# Patient Record
Sex: Male | Born: 1960 | Race: White | Hispanic: No | Marital: Married | Smoking: Never smoker
Health system: Southern US, Community
[De-identification: ages and names within clinical notes are randomized; demographics above are authoritative.]

## PROBLEM LIST (undated history)

## (undated) HISTORY — PX: WRIST FRACTURE SURGERY: SHX121

---

## 2010-05-22 ENCOUNTER — Encounter (HOSPITAL_BASED_OUTPATIENT_CLINIC_OR_DEPARTMENT_OTHER)
Admission: RE | Admit: 2010-05-22 | Discharge: 2010-05-22 | Disposition: A | Discharge status: Home / Self Care | Payer: BC Managed Care – PPO | Source: Ambulatory Visit | Attending: Orthopedic Surgery | Admitting: Orthopedic Surgery

## 2010-05-24 ENCOUNTER — Ambulatory Visit (HOSPITAL_BASED_OUTPATIENT_CLINIC_OR_DEPARTMENT_OTHER)
Admission: RE | Admit: 2010-05-24 | Discharge: 2010-05-24 | Disposition: A | Discharge status: Home / Self Care | Payer: BC Managed Care – PPO | Source: Ambulatory Visit | Attending: Orthopedic Surgery | Admitting: Orthopedic Surgery

## 2010-05-24 DIAGNOSIS — J4489 Other specified chronic obstructive pulmonary disease: Secondary | ICD-10-CM | POA: Insufficient documentation

## 2010-05-24 DIAGNOSIS — S52599A Other fractures of lower end of unspecified radius, initial encounter for closed fracture: Secondary | ICD-10-CM | POA: Insufficient documentation

## 2010-05-24 DIAGNOSIS — Z87891 Personal history of nicotine dependence: Secondary | ICD-10-CM | POA: Insufficient documentation

## 2010-05-24 DIAGNOSIS — Y92838 Other recreation area as the place of occurrence of the external cause: Secondary | ICD-10-CM | POA: Insufficient documentation

## 2010-05-24 DIAGNOSIS — J449 Chronic obstructive pulmonary disease, unspecified: Secondary | ICD-10-CM | POA: Insufficient documentation

## 2010-05-24 DIAGNOSIS — Y9239 Other specified sports and athletic area as the place of occurrence of the external cause: Secondary | ICD-10-CM | POA: Insufficient documentation

## 2010-05-24 DIAGNOSIS — Z01810 Encounter for preprocedural cardiovascular examination: Secondary | ICD-10-CM | POA: Insufficient documentation

## 2010-05-26 NOTE — Op Note (Signed)
NAME:  Austin Austin, Austin Austin                ACCOUNT NO.:  [id number]  MEDICAL RECORD NO.:  [id number]           PATIENT TYPE:  LOCATION:                                 FACILITY:  PHYSICIAN:  Feliberto Gottron. Turner Daniels, M.D.        DATE OF BIRTH:  DATE OF PROCEDURE:  05/24/2010 DATE OF DISCHARGE:                              OPERATIVE REPORT   PREOPERATIVE DIAGNOSIS:  Displaced angulated right distal radius fracture.  POSTOPERATIVE DIAGNOSIS:  Displaced angulated right distal radius fracture.  PROCEDURE:  Open reduction and internal fixation with a DePuy standard right DVR plate, three proximal bicortical screws, and a full spread of distal fixed screws and pins.  SURGEON:  Feliberto Gottron. Turner Daniels, MD  FIRST ASSISTANT:  Shirl Harris, PA  ANESTHETIC:  Right axillary nerve block and general LMA.  ESTIMATED BLOOD LOSS:  Minimal.  FLUID REPLACEMENT:  A liter of crystalloid.  DRAINS PLACED:  None.  TOURNIQUET TIME:  45 minutes.  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  In order to decrease pain and increase function, he is taken for open reduction and internal fixation using a DVR plate to stabilize the fracture.  Risks and benefits of surgery have been discussed, questions answered.  DESCRIPTION OF PROCEDURE:  The patient was identified by armband and received preoperative IV antibiotics in the holding area at [facility] and taken to operating room 1.  Appropriate anesthetic monitors were attached and LMA anesthesia was induced with the patient in supine position after first receiving axillary block anesthesia in the block area.  Tourniquet was applied to the right forearm, and the right upper extremity was prepped and draped in a sterile fashion from the fingertips to the tourniquet.   Time-out procedure was performed. Limb wrapped with an Esmarch bandage, tourniquet was inflated to 250 mmHg.  We began the operation by making a longitudinal incision starting at the wrist flexion crease and going proximally for about 8 cm through the skin and subcutaneous tissue down to the FCR tendon.  The sheath was incised volarly, the tendon was retracted laterally, and we went through the dorsal aspect of the sheath dropping down onto the pronator quadratus muscle.  This was detached from the distal radius exposing the fracture site.  We used a periosteal elevator to get good visualization of the fracture.  With traction and reverse angulation, the fracture was relatively easy to reduce and at that point was selected a right regular DePuy DVR plate and applied it to the volar aspect of the distal radius at the level of the glide hole with a 12-mm bicortical 3.5 screw.  The glide hole allowed adjustment, and at this point, once again we applied traction and reduced the fracture and pinned it through one of the distal pin holes.  C-arm imaging was then brought up to confirm near anatomic reduction, and we set about fixing the distal radius to the proximal radius using a  combination of threaded fixed pins and then unthreaded fixed pins.  All holes were filled, C-arm images were taken in the AP and lateral plane confirming that the pins were well distal just below the subchondral bone and that none of them penetrated the joint.  Satisfied with the anatomic reduction on x-ray, two more of the bicortical proximal holes were filled.  Tourniquet was let down.  C-arm images were taken.  Wound irrigated out with normal saline solution, we checked for any small bleeders, and then we closed in layers of 3-0 Vicryl suture in the subcutaneous tissue and 3-0 Vicryl suture in the subcuticular tissue.  A dressing of Xeroform, 4x4 dressing sponges, Webril, and an Ace wrap applied followed by a right  Velfoam wrist forearm splint.  The patient was then awakened, extubated, and taken to the recovery room without difficulty.     Feliberto Gottron. Turner Daniels, M.D.     Ovid Curd  D:  05/24/2010  T:  05/25/2010  Job:  [id number]  Electronically Signed by Gean Birchwood M.D. on 05/26/2010 06:38:59 AM

## 2020-01-28 ENCOUNTER — Encounter: Payer: Self-pay | Admitting: Neurology

## 2020-04-27 NOTE — Progress Notes (Signed)
[facility] Clinic Note - Initial Visit   Date: 05/02/20  Austin Austin MRN: [id number] DOB: [birth year]   Dear Dr. Lenise Arena:  Thank you for your kind referral of Austin Austin for consultation of numbness/tingling. Although his history is well known to you, please allow [place] to reiterate it for the purpose of our medical record. The patient was accompanied to the clinic by self.   History of Present Illness: Austin Austin is a 60 y.o. right-handed male presenting for evaluation of numbness/tingling of the hands and feet.   Starting in January 2021, he woke up with severe leg cramps and numbness involving his right lower leg. A month later, he developed tingling in the left leg. He was referred to see a chiropractor which did not help.  He also reports tingling in the fingertips.  His symptoms are constant and he feels that exercise and cold makes it worse.  He has chronic low back pain. He has problems with coordination, especially in the hands.  No weakness or falls.  He sometimes feels the he is veering to the side when walking, but it is very subtle.   He drinks alcohol socially on the weekend. No diabetes.  He also reports easily getting tired.   Out-side paper records, electronic medical record, and images have been reviewed where available and summarized as:  Labs 01/25/2020:  Folate > 20, vitamin B12 915  History reviewed. No pertinent past medical history.  Past Surgical History:  Procedure Laterality Date  . WRIST FRACTURE SURGERY Right      Medications:  Outpatient Encounter Medications as of 05/02/2020  Medication Sig  . Loratadine 10 MG CAPS 1 tablet  . Multiple Vitamins-Minerals (MULTIVITAMIN ADULTS 50+) TABS See admin instructions.   No facility-administered encounter medications on file as of 05/02/2020.    Allergies: No Known Allergies  Family History: Family History  Problem Relation Age of Onset  . Parkinson's disease Mother   . Heart  disease Father   . Healthy Brother     Social History: Social History   Tobacco Use  . Smoking status: Never Smoker  . Smokeless tobacco: Never Used  Vaping Use  . Vaping Use: Never used  Substance Use Topics  . Alcohol use: Yes    Comment: occasional  . Drug use: Never   Social History   Social History Narrative   Right handed   Lives in a 2 story home    Drinks caffeine   Works as professor at [employer]     Vital Signs:  BP 120/81   Pulse 72   Ht 6\' 1"  (1.854 m)   Wt 187 lb (84.8 kg)   SpO2 100%   BMI 24.67 kg/m   Neurological Exam: MENTAL STATUS including orientation to time, place, person, recent and remote memory, attention span and concentration, language, and fund of knowledge is normal.  Speech is not dysarthric.  CRANIAL NERVES: II:  No visual field defects.    III-IV-VI: Pupils equal round and reactive to light.  Normal conjugate, extra-ocular eye movements in all directions of gaze.  No nystagmus.  No ptosis.   V:  Normal facial sensation.    VII:  Normal facial symmetry and movements.   VIII:  Normal hearing and vestibular function.   IX-X:  Normal palatal movement.   XI:  Normal shoulder shrug and head rotation.   XII:  Normal tongue strength and range of motion, no deviation or fasciculation.  MOTOR:  No atrophy, fasciculations or  abnormal movements.  No pronator drift.   Upper Extremity:  Right  Left  Deltoid  5/5   5/5   Biceps  5/5   5/5   Triceps  5/5   5/5   Infraspinatus 5/5  5/5  Medial pectoralis 5/5  5/5  Wrist extensors  5/5   5/5   Wrist flexors  5/5   5/5   Finger extensors  5/5   5/5   Finger flexors  5/5   5/5   Dorsal interossei  5/5   5/5   Abductor pollicis  5/5   5/5   Tone (Ashworth scale)  0  0   Lower Extremity:  Right  Left  Hip flexors  5/5   5/5   Hip extensors  5/5   5/5   Adductor 5/5  5/5  Abductor 5/5  5/5  Knee flexors  5/5   5/5   Knee extensors  5/5   5/5   Dorsiflexors  5/5   5/5   Plantarflexors  5/5    5/5   Toe extensors  5/5   5/5   Toe flexors  5/5   5/5   Tone (Ashworth scale)  0  0   MSRs:  Right        Left                  brachioradialis 2+  2+  biceps 2+  2+  triceps 2+  2+  patellar 3+  3+  ankle jerk 2+  2+  Hoffman no  no  plantar response down  down   SENSORY:  Normal and symmetric perception of light touch, pinprick, vibration, and proprioception.  Romberg's sign absent.   COORDINATION/GAIT: Normal finger-to- nose-finger and heel-to-shin.  Intact rapid alternating movements bilaterally.  Able to rise from a chair without using arms.  Gait narrow based and stable. Mild unsteadiness with tandem gait.  Stressed gait intact.    IMPRESSION: 1.  Paresthesias of the legs >> fingertips 2.  Muscle cramps 3.  Chronic low back pain  Imaging of the lumbar spine will be ordered to look for compressive pathology, given the presence of hyperreflexia on his exam.  If this is normal, additional imaging of the cervical spine will be performed next. Unlikely neuropathy.   PLAN/RECOMMENDATIONS:  MRI lumbar spine wo contrast  Thank you for allowing me to participate in patient's care.  If I can answer any additional questions, I would be pleased to do so.    Sincerely,    Isys Tietje K. Allena Katz, DO

## 2020-05-02 ENCOUNTER — Ambulatory Visit: Payer: BC Managed Care – PPO | Admitting: Neurology

## 2020-05-02 ENCOUNTER — Encounter: Payer: Self-pay | Admitting: Neurology

## 2020-05-02 ENCOUNTER — Other Ambulatory Visit: Payer: Self-pay

## 2020-05-02 VITALS — BP 120/81 | HR 72 | Ht 73.0 in | Wt 187.0 lb

## 2020-05-02 DIAGNOSIS — R252 Cramp and spasm: Secondary | ICD-10-CM

## 2020-05-02 DIAGNOSIS — R202 Paresthesia of skin: Secondary | ICD-10-CM

## 2020-05-02 DIAGNOSIS — R292 Abnormal reflex: Secondary | ICD-10-CM

## 2020-05-02 NOTE — Patient Instructions (Signed)
We will order MRI lumbar spine and call you with the results.  Based on this, we will determine the next step.

## 2020-05-20 ENCOUNTER — Ambulatory Visit
Admission: RE | Admit: 2020-05-20 | Discharge: 2020-05-20 | Disposition: A | Discharge status: Home / Self Care | Payer: BC Managed Care – PPO | Source: Ambulatory Visit | Attending: Neurology | Admitting: Neurology

## 2020-05-20 ENCOUNTER — Other Ambulatory Visit: Payer: Self-pay

## 2020-05-20 DIAGNOSIS — R202 Paresthesia of skin: Secondary | ICD-10-CM

## 2020-05-20 DIAGNOSIS — R292 Abnormal reflex: Secondary | ICD-10-CM

## 2020-05-20 DIAGNOSIS — R252 Cramp and spasm: Secondary | ICD-10-CM

## 2020-05-20 IMAGING — MR MR LUMBAR SPINE W/O CM
4 of 5 series · 27 of 48 positions shown · non-contrast
Comparison: None.

CLINICAL DATA: Lumbar radiculopathy

EXAM:
MRI LUMBAR SPINE WITHOUT CONTRAST
TECHNIQUE: Multiplanar, multisequence MR imaging of the lumbar spine was
performed. No intravenous contrast was administered.

[Series 3: T2 · sagittal · 4.0mm · 1.09mm/px · 6 of 17 slices shown (1 of 2)]
[im 1/17]
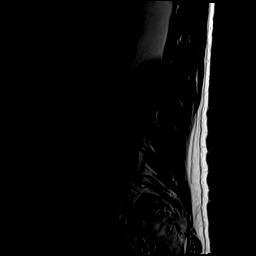
[im 4/17]
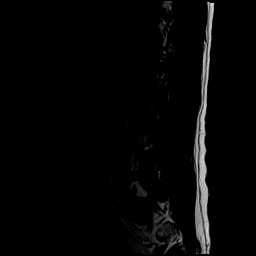
[im 7/17]
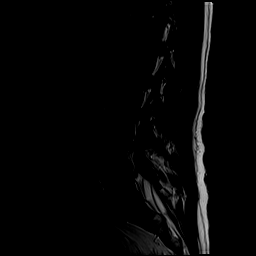
[im 10/17]
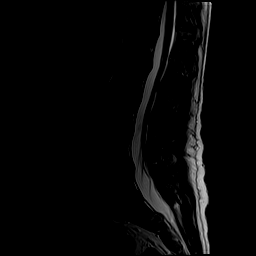
[im 13/17]
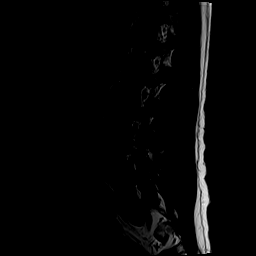
[im 17/17]
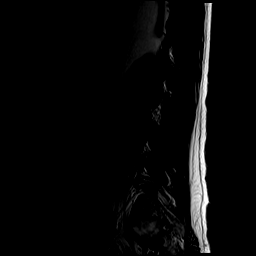

[Series 5: T1 · sagittal · 4.0mm · 1.09mm/px · 6 of 17 slices shown (1 of 2)]
[im 1/17]
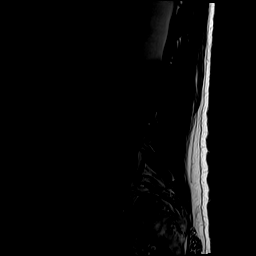
[im 4/17]
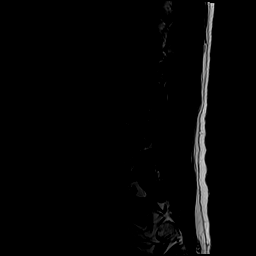
[im 7/17]
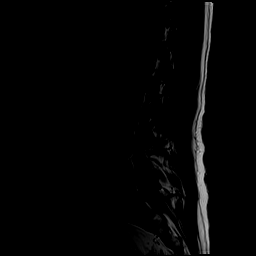
[im 10/17]
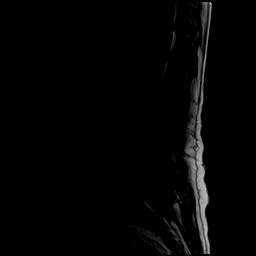
[im 13/17]
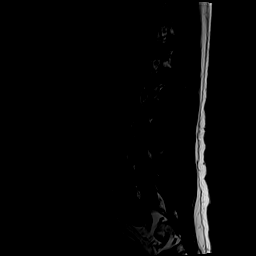
[im 17/17]
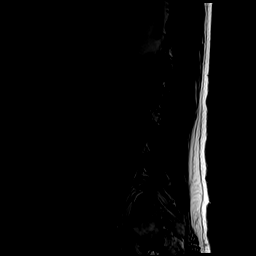

[Series 6: T2 · axial · 4.0mm · 0.39mm/px · z∈[-100,+133]mm · 9 of 45 slices shown (2 of 2)]
[im 1/45]
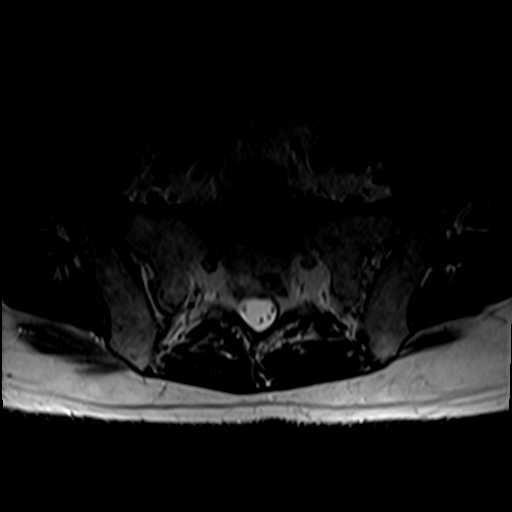
[im 7/45]
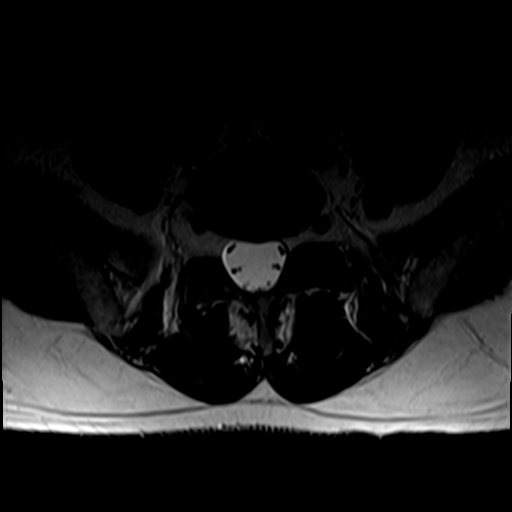
[im 13/45]
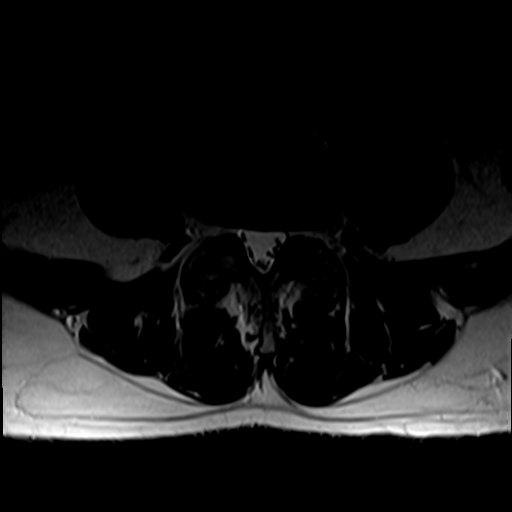
[im 19/45]
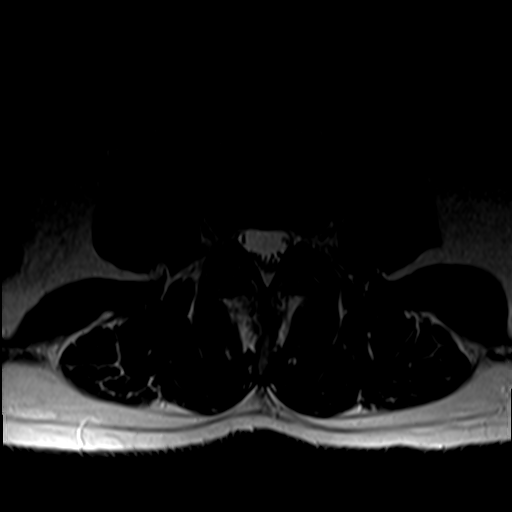
[im 23/45]
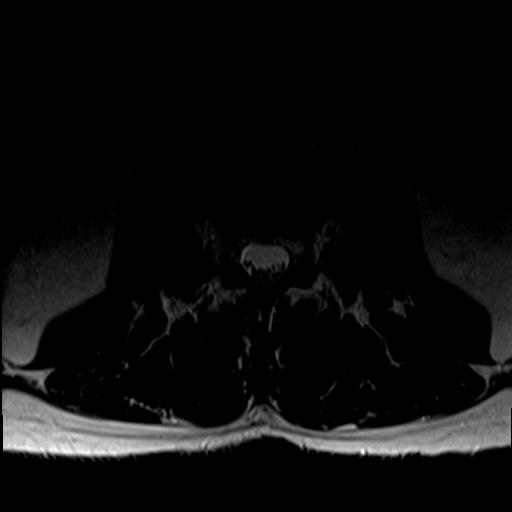
[im 26/45]
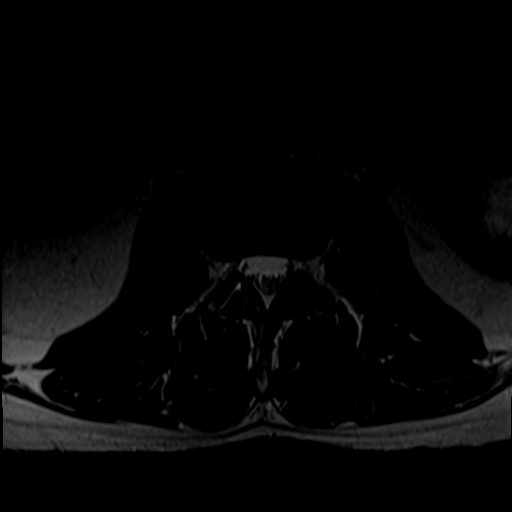
[im 32/45]
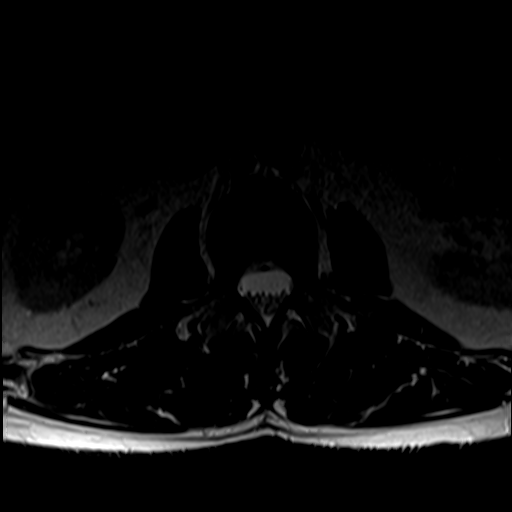
[im 38/45]
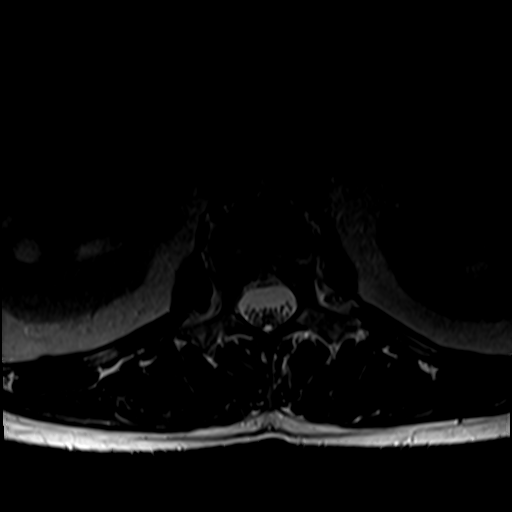
[im 45/45]
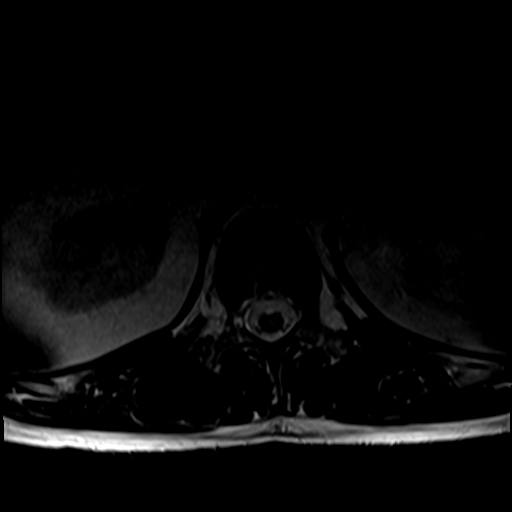

[Series 7: T1 · axial · 4.0mm · 0.39mm/px · z∈[-100,+100]mm · 6 of 45 slices shown (2 of 2)]
[im 1/45]
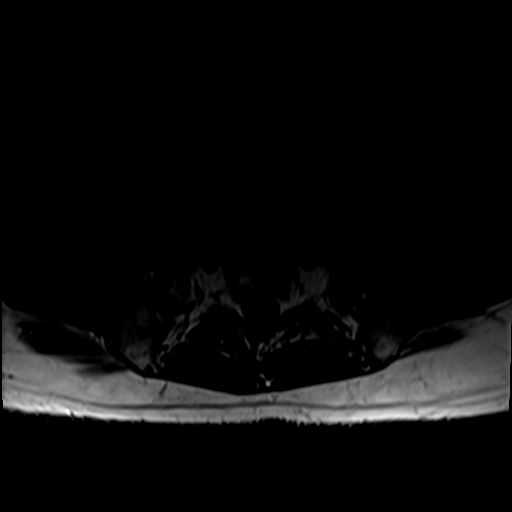
[im 7/45]
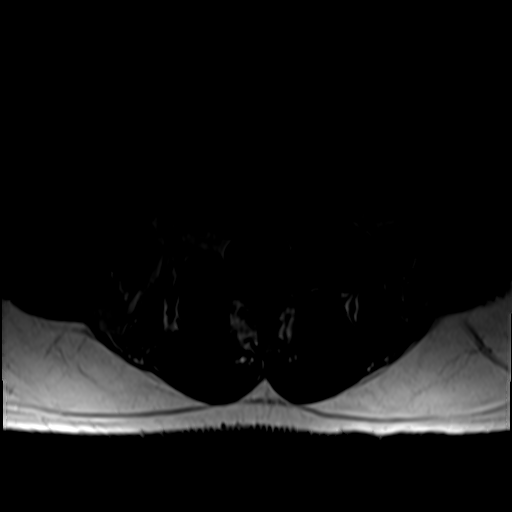
[im 13/45]
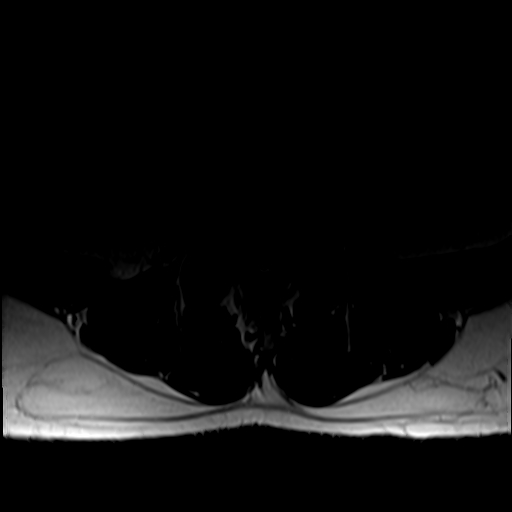
[im 19/45]
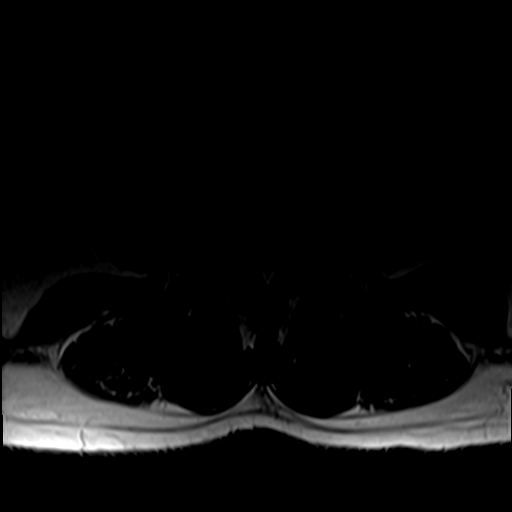
[im 23/45]
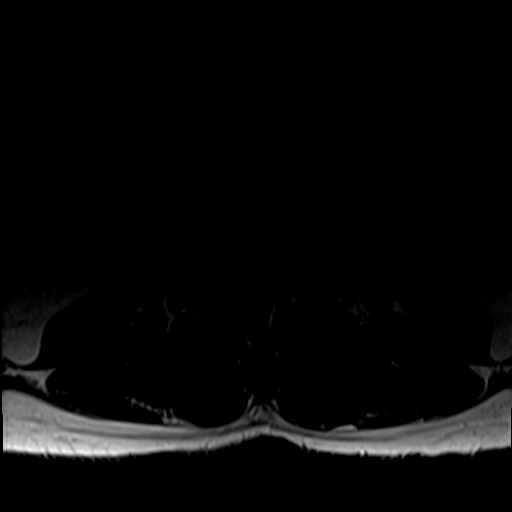
[im 38/45]
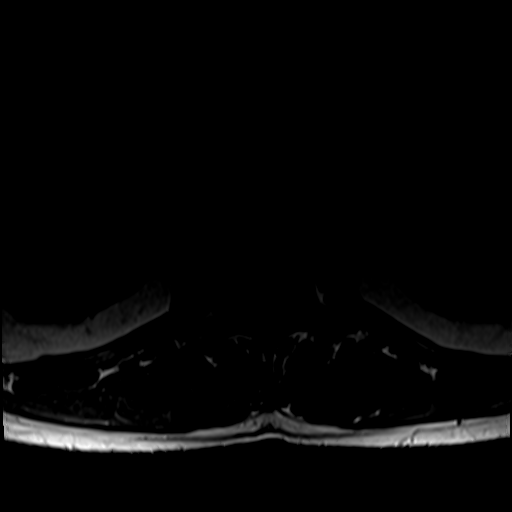

[27 of 48 positions shown; findings below may reference images not displayed]

FINDINGS: Segmentation: There are 5 non-rib bearing lumbar type vertebral
bodies with the last intervertebral disc space labeled as L5-S1.

Alignment:  Normal

Vertebrae: The vertebral body heights are well maintained. No
fracture, marrow edema,or pathologic marrow infiltration.

Conus medullaris and cauda equina: Conus extends to the L1 level.
Conus and cauda equina appear normal.

Paraspinal and other soft tissues: The paraspinal soft tissues and
visualized retroperitoneal structures are unremarkable. The
sacroiliac joints are intact.

Disc levels:

T12-L1:  No significant canal or neural foraminal narrowing.

L1-L2:   No significant canal or neural foraminal narrowing.

L2-L3: Minimal broad-based disc bulge, however no significant canal
or neural foraminal narrowing.

L3-L4: There is a broad-based disc bulge with mild bilateral neural
foraminal narrowing.

L4-L5: No there is a broad-based disc bulge with a left far lateral
disc protrusion which contacts the left L4 nerve root without
impingement. There is facet arthrosis and ligamentum flavum
hypertrophy which causes mild bilateral neural foraminal narrowing
and mild effacement anterior thecal sac.

L5-S1: There is a broad-based disc bulge with mild left neural
foraminal narrowing
IMPRESSION: Lumbar spine spondylosis most notable at L4-L5 with a left far
lateral disc protrusion contacting the left L4 nerve root without
impingement. There is also mild bilateral neural foraminal
narrowing.

## 2020-05-30 ENCOUNTER — Telehealth: Payer: Self-pay

## 2020-05-30 DIAGNOSIS — M5416 Radiculopathy, lumbar region: Secondary | ICD-10-CM

## 2020-05-30 DIAGNOSIS — R202 Paresthesia of skin: Secondary | ICD-10-CM

## 2020-05-30 NOTE — Telephone Encounter (Signed)
-----   Message from Glendale Chard, DO sent at 05/27/2020  5:21 PM EST ----- Results discussed with patient, which does not explain his leg paresthesias.  He denies radicular symptoms. Next step his MRI cervical spine wo contrast - please order. Thanks.

## 2020-06-12 ENCOUNTER — Ambulatory Visit
Admission: RE | Admit: 2020-06-12 | Discharge: 2020-06-12 | Disposition: A | Discharge status: Home / Self Care | Payer: BC Managed Care – PPO | Source: Ambulatory Visit | Attending: Neurology | Admitting: Neurology

## 2020-06-12 ENCOUNTER — Other Ambulatory Visit: Payer: Self-pay

## 2020-06-12 DIAGNOSIS — M5416 Radiculopathy, lumbar region: Secondary | ICD-10-CM

## 2020-06-12 DIAGNOSIS — R202 Paresthesia of skin: Secondary | ICD-10-CM

## 2020-06-12 IMAGING — MR MR CERVICAL SPINE W/O CM
5 series · 28 of 48 positions shown · non-contrast
Comparison: None available.

CLINICAL DATA: Initial evaluation for history of bilateral upper
extremity and lower extremity numbness, cervical radiculopathy.

EXAM:
MRI CERVICAL SPINE WITHOUT CONTRAST
TECHNIQUE: Multiplanar, multisequence MR imaging of the cervical spine was
performed. No intravenous contrast was administered.

[Series 2: T2 · sagittal · 3.0mm · 0.66mm/px · 7 of 13 slices shown (1 of 2)]
[im 1/13]
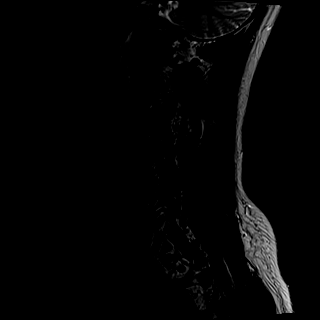
[im 3/13]
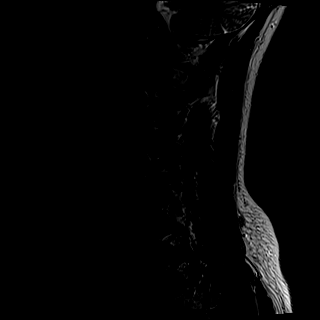
[im 5/13]
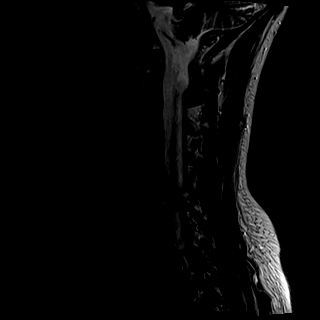
[im 7/13]
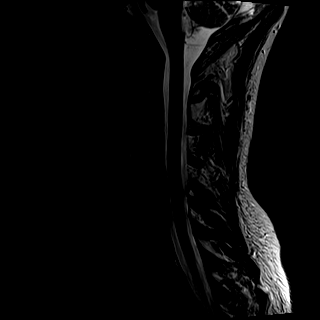
[im 9/13]
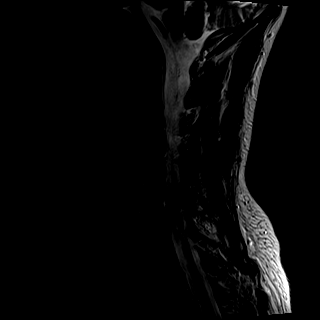
[im 11/13]
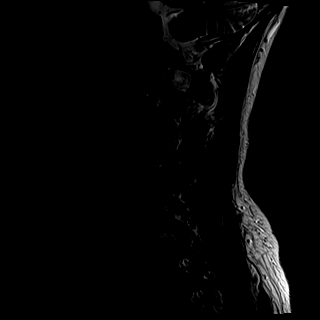
[im 13/13]
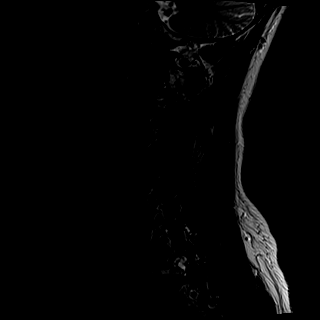

[Series 3: T1 · sagittal · 3.0mm · 0.41mm/px · 6 of 13 slices shown]
[im 1/13]
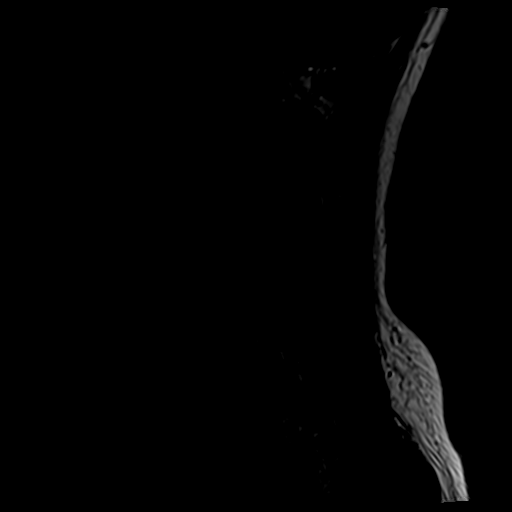
[im 3/13]
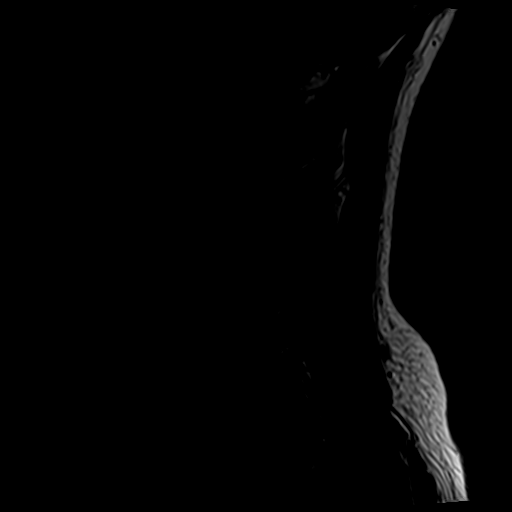
[im 5/13]
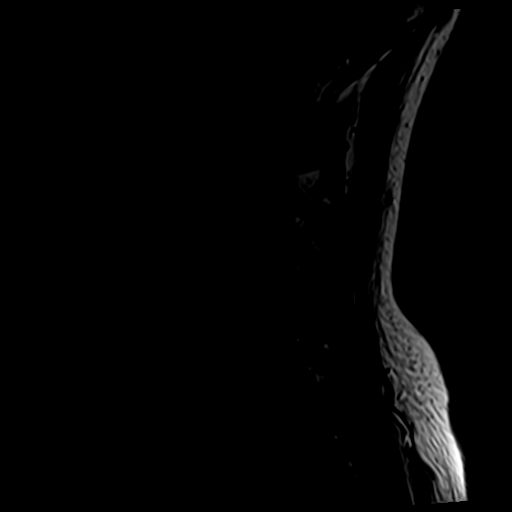
[im 8/13]
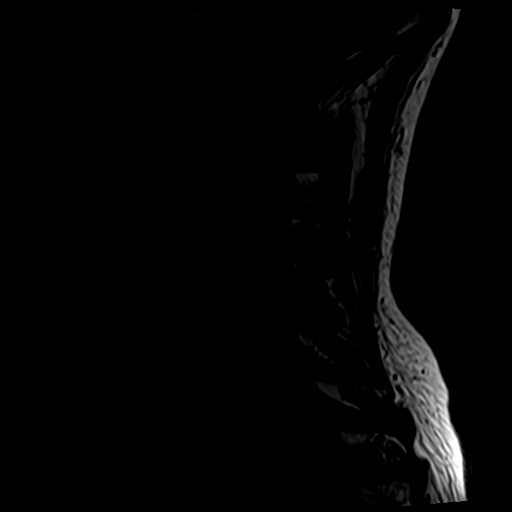
[im 10/13]
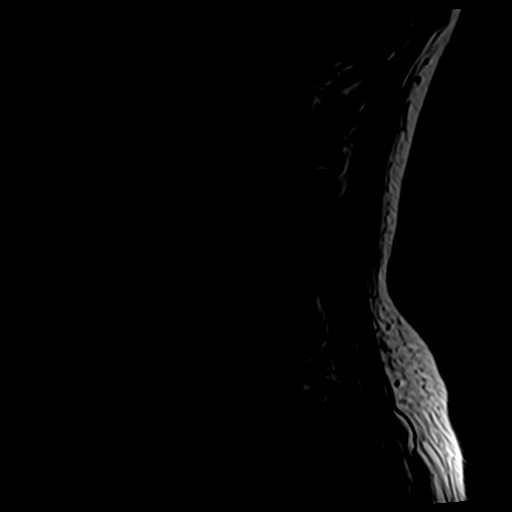
[im 13/13]
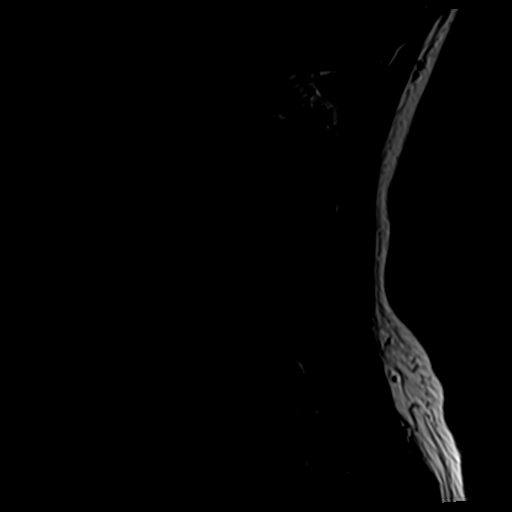

[Series 4: tir sag · sagittal · 3.0mm · 0.41mm/px · 6 of 13 slices shown]
[im 1/13]
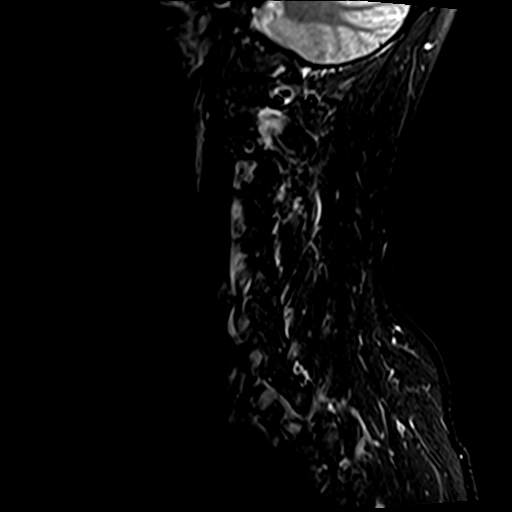
[im 3/13]
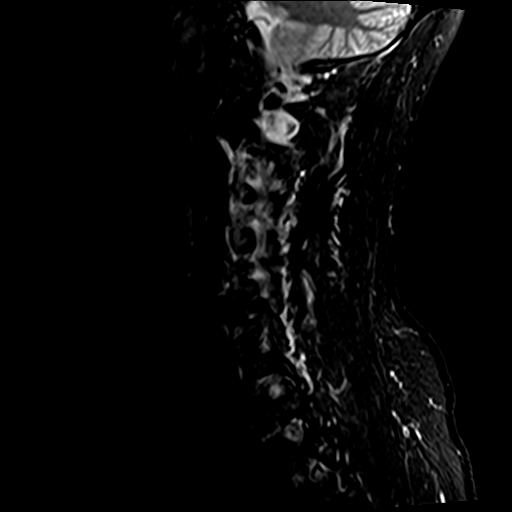
[im 5/13]
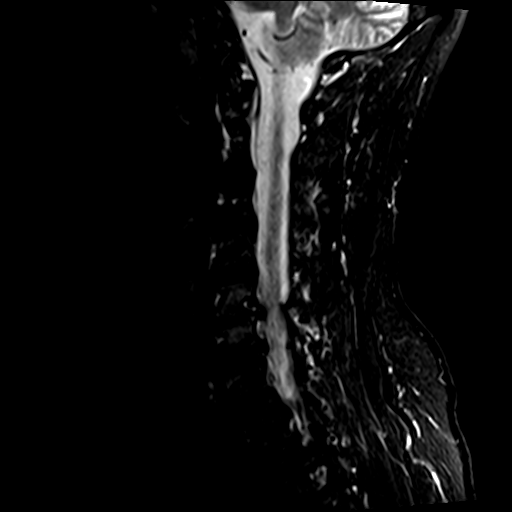
[im 8/13]
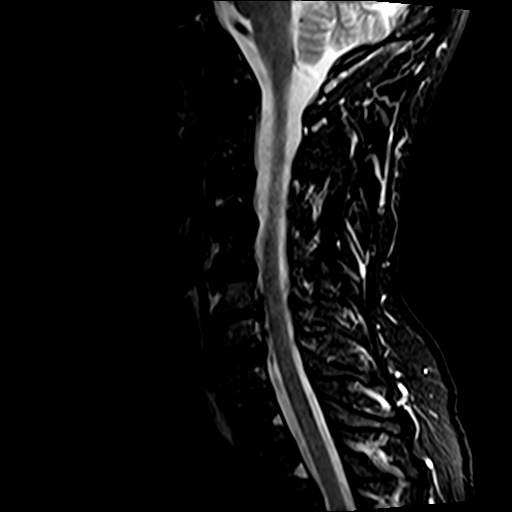
[im 10/13]
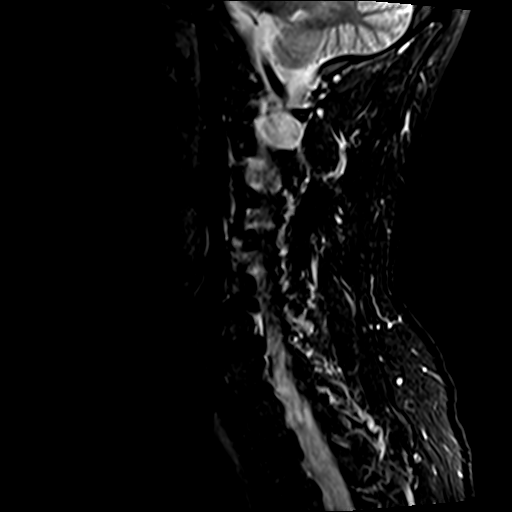
[im 13/13]
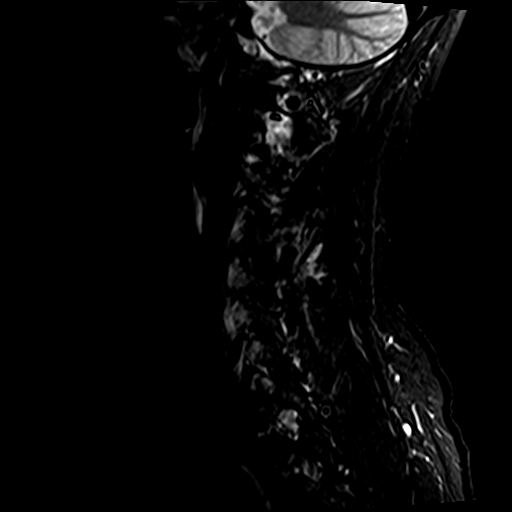

[Series 5: GRE · axial · 3.0mm · 0.35mm/px · 1 of 30 slices shown]
[im 1/30]
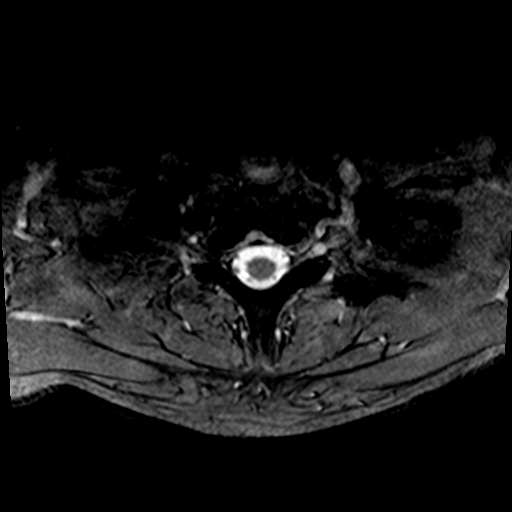

[Series 6: T2 · axial · 3.0mm · 0.70mm/px · z∈[-71,+38]mm · 8 of 29 slices shown (2 of 2)]
[im 1/29]
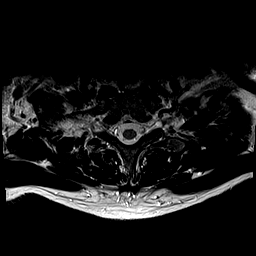
[im 5/29]
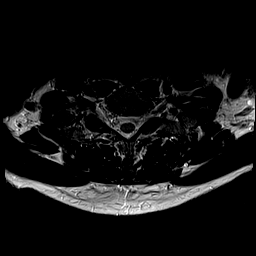
[im 9/29]
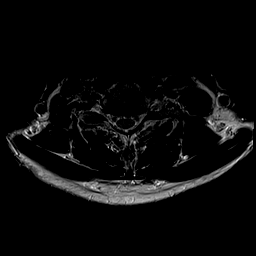
[im 13/29]
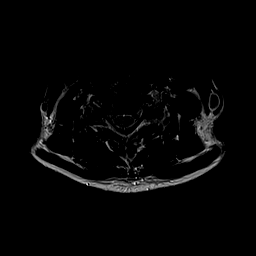
[im 16/29]
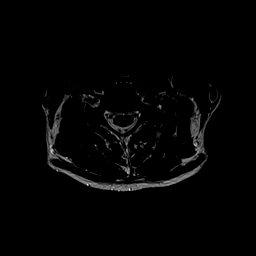
[im 20/29]
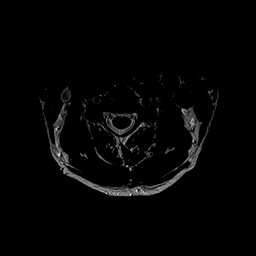
[im 24/29]
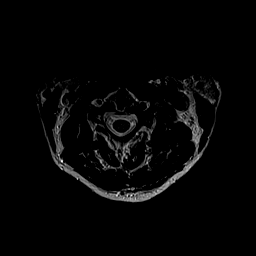
[im 29/29]
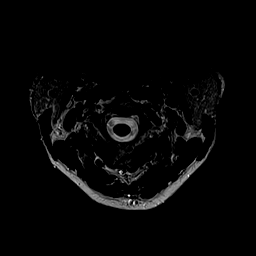

[28 of 48 positions shown; findings below may reference images not displayed]

FINDINGS: Alignment: Straightening of the normal cervical lordosis. No
listhesis.

Vertebrae: Vertebral body height maintained without acute or chronic
fracture. Bone marrow signal intensity within normal limits. No
worrisome osseous lesions. Discogenic reactive endplate change
present about the C5-6 and C6-7 interspaces, with associated marrow
edema at C5-6. No other abnormal marrow edema.

Cord: Normal signal morphology.

Posterior Fossa, vertebral arteries, paraspinal tissues: Visualized
brain and posterior fossa within normal limits. Craniocervical
junction normal. Paraspinous and prevertebral soft tissues within
normal limits. Normal intravascular flow voids seen within the
vertebral arteries bilaterally.

Disc levels:

C2-C3: Unremarkable.

C3-C4:  Unremarkable.

C4-C5: Minimal disc bulge with uncovertebral hypertrophy. Mild facet
degeneration. No significant spinal stenosis. Foramina remain
patent.

C5-C6: Degenerative intervertebral disc space narrowing with diffuse
disc osteophyte, eccentric to the right, with associated right worse
than left uncovertebral hypertrophy. Flattening and partial
effacement of the ventral thecal sac with resultant mild spinal
stenosis. Severe right with moderate left C6 foraminal narrowing.

C6-C7: Degenerative intervertebral disc space narrowing with diffuse
disc osteophyte complex. Flattening of the ventral thecal sac
without significant spinal stenosis. Mild moderate left worse than
right C7 foraminal stenosis.

C7-T1:  Negative interspace.  Mild facet hypertrophy.  No stenosis.

Visualized upper thoracic spine demonstrates mild noncompressive
disc bulging at T2-3 without significant stenosis.
IMPRESSION: 1. Right eccentric disc osteophyte at C5-6 with resultant mild
canal, with severe right and moderate left C6 foraminal stenosis.
2. Degenerative disc osteophyte at C6-7 with resultant mild to
moderate left worse than right C7 foraminal stenosis.

## 2020-06-21 ENCOUNTER — Telehealth: Payer: Self-pay

## 2020-06-21 DIAGNOSIS — M5416 Radiculopathy, lumbar region: Secondary | ICD-10-CM

## 2020-06-21 DIAGNOSIS — G542 Cervical root disorders, not elsewhere classified: Secondary | ICD-10-CM

## 2020-06-21 DIAGNOSIS — R202 Paresthesia of skin: Secondary | ICD-10-CM

## 2020-06-21 NOTE — Telephone Encounter (Signed)
-----   Message from Glendale Chard, DO sent at 06/21/2020  2:10 PM EDT ----- Please inform pt that his MRI shows arthritic changes in the cervical spine where there is narrowing causing nerve impingement which is most likely contributing to his hand symptoms.  Recommend start neck PT to see if this will improve his symptoms. Thanks.

## 2020-06-21 NOTE — Telephone Encounter (Signed)
Called patient and left a message for a call back.  

## 2020-06-22 NOTE — Telephone Encounter (Signed)
Called patient and informed him of MRI results and recommendations. Patient would like a PT referral sent. Informed patient I will go ahead and sent his referral to [facility].   Patient wanted to ask Dr. Allena Katz what his next steps are as far as following up? Looked at last note and did not see a f/u recommendation, so I informed patient I would send Dr. Allena Katz a message and get back to him. Patient verbalized understanding.

## 2020-06-22 NOTE — Telephone Encounter (Signed)
-----   Message from Donika K Patel, DO sent at 06/21/2020  2:10 PM EDT ----- Please inform pt that his MRI shows arthritic changes in the cervical spine where there is narrowing causing nerve impingement which is most likely contributing to his hand symptoms.  Recommend start neck PT to see if this will improve his symptoms. Thanks.  

## 2020-06-23 NOTE — Telephone Encounter (Signed)
Let's have him start PT and then follow-up with me in 2-3 months to see his progress.

## 2020-06-23 NOTE — Telephone Encounter (Signed)
Called patient and left a message on his voicemail letting him know to f/u with Dr. Allena Katz in 2-3 months after he begins PT to see his progress. Informed patient on voicemail to give [place] a call if he has any questions or concerns.

## 2020-10-17 ENCOUNTER — Encounter: Payer: Self-pay | Admitting: Neurology

## 2020-10-17 ENCOUNTER — Ambulatory Visit: Payer: BC Managed Care – PPO | Admitting: Neurology

## 2020-10-17 ENCOUNTER — Other Ambulatory Visit: Payer: Self-pay

## 2020-10-17 VITALS — BP 137/82 | HR 70 | Ht 73.0 in | Wt 190.5 lb

## 2020-10-17 DIAGNOSIS — R252 Cramp and spasm: Secondary | ICD-10-CM | POA: Diagnosis not present

## 2020-10-17 DIAGNOSIS — R292 Abnormal reflex: Secondary | ICD-10-CM

## 2020-10-17 DIAGNOSIS — R202 Paresthesia of skin: Secondary | ICD-10-CM

## 2020-10-17 MED ORDER — CYCLOBENZAPRINE HCL 5 MG PO TABS: 5.0000 mg | ORAL_TABLET | Freq: Every evening | ORAL | 1 refills | Status: AC | PRN

## 2020-10-17 NOTE — Patient Instructions (Signed)
MRI thoracic spine  Nerve testing of the right arm and leg  For severe cramps, take flexeril 5mg  at bedtime as needed

## 2020-10-17 NOTE — Progress Notes (Signed)
Follow-up Visit   Date: 10/17/20   Austin Austin MRN: [id number] DOB: [birth year]   Interim History: Austin Austin is a 60 y.o. right-handed Caucasian male returning to the clinic for follow-up of muscle cramps and paresthesias.  The patient was accompanied to the clinic by self.   History of present illness: Starting in January 2021, he woke up with severe leg cramps and numbness involving his right lower leg. A month later, he developed tingling in the left leg. He was referred to see a chiropractor which did not help.  He also reports tingling in the fingertips.  His symptoms are constant and he feels that exercise and cold makes it worse.  He has chronic low back pain. He has problems with coordination, especially in the hands.  No weakness or falls.  He sometimes feels the he is veering to the side when walking, but it is very subtle.   He drinks alcohol socially on the weekend. No diabetes.  He also reports easily getting tired.  UPDATE 10/17/2020:  He is here for follow-up visit.  At his last visit, imaging of the cervical spine and lumbar spine was ordered to evaluate his symptoms.  MRI cervical spine shows disc osteophyte at C5-6 with biforaminal stenosis, worse on the right where it is severe.  MRI lumbar spine shows disc protrusion at L4-5 causing left L4 nerve root impingement.  He completed PT and he feels that stretching has helped a little.  He continues to get cramps and has constant numbness/tingling in the feet and lower legs.  Hand paresthesias are intermittent.  Activity tends to make his symptoms worse.   Medications:  Current Outpatient Medications on File Prior to Visit  Medication Sig Dispense Refill   Loratadine 10 MG CAPS 1 tablet     Multiple Vitamins-Minerals (MULTIVITAMIN ADULTS 50+) TABS See admin instructions.     No current facility-administered medications on file prior to visit.    Allergies:  Allergies  Allergen Reactions   Bee Venom Rash and  Swelling    Vital Signs:  BP 137/82   Pulse 70   Ht 6\' 1"  (1.854 m)   Wt 190 lb 8 oz (86.4 kg)   SpO2 98%   BMI 25.13 kg/m   Neurological Exam: MENTAL STATUS including orientation to time, place, person, recent and remote memory, attention span and concentration, language, and fund of knowledge is normal.  Speech is not dysarthric.  CRANIAL NERVES:  No visual field defects.  Pupils equal round and reactive to light.  Normal conjugate, extra-ocular eye movements in all directions of gaze.  No ptosis   MOTOR:  Motor strength is 5/5 in all extremities.  No atrophy, fasciculations or abnormal movements.  No pronator drift.  Tone is normal.    MSRs:                                           Right        Left brachioradialis 2+  2+  biceps 2+  2+  triceps 2+  2+  patellar 3+  3+  ankle jerk 2+  2+   SENSORY:  Intact to vibration, temperature, and pin prick throughout.  COORDINATION/GAIT:  Normal finger-to- nose-finger.  Intact rapid alternating movements bilaterally.  Gait narrow based and stable.   Data: MRI cervical spine wo contrast 06/13/2020: 1. Right eccentric disc osteophyte at C5-6  with resultant mildcanal, with severe right and moderate left C6 foraminal stenosis. 2. Degenerative disc osteophyte at C6-7 with resultant mild to moderate left worse than right C7 foraminal stenosis.  MRI lumbar spine wo contrast 05/20/2020: Lumbar spine spondylosis most notable at L4-L5 with a left far lateral disc protrusion contacting the left L4 nerve root without impingement. There is also mild bilateral neural foraminal narrowing.    IMPRESSION/PLAN:  Muscle cramps  Bilateral hand paresthesias  Bilateral feet and lower leg paresthesia  MRI cervical spine shows disc osteophyte at C5-6 with biforaminal stenosis, worse on the right where it is severe. Although this may manifesting with radicular arm symptoms, it would not explain his leg paresthesias or cramps.  MRI lumbar spine shows  disc protrusion at L4-5 causing left L4 nerve root impingement. No impingement to cause right leg symptoms.  Exam continues to show brisk reflexes in the legs.  I will check MRI thoracic spine to evaluate for compressive pathology. If this is unremarkable, imaging of the brain will be next.  Although my suspicion for neuropathy remains low, I recommend also check NCS/EMG of the right arm and leg to better understand his parethesias.  For cramps, start flexeril 5mg  at bedtime as needed  Further recommendations pending results.   Thank you for allowing me to participate in patient's care.  If I can answer any additional questions, I would be pleased to do so.    Sincerely,    Chrisandra Wiemers K. , DO

## 2020-10-24 ENCOUNTER — Ambulatory Visit
Admission: RE | Admit: 2020-10-24 | Discharge: 2020-10-24 | Disposition: A | Discharge status: Home / Self Care | Payer: BC Managed Care – PPO | Source: Ambulatory Visit | Attending: Neurology | Admitting: Neurology

## 2020-10-24 DIAGNOSIS — R292 Abnormal reflex: Secondary | ICD-10-CM

## 2020-10-24 IMAGING — MR MR THORACIC SPINE W/O CM
4 of 5 series · 21 of 48 positions shown · non-contrast
Comparison: None.

CLINICAL DATA: Numbness in both hands and legs for 18 months.

EXAM:
MRI THORACIC SPINE WITHOUT CONTRAST
TECHNIQUE: Multiplanar, multisequence MR imaging of the thoracic spine was
performed. No intravenous contrast was administered.

[Series 17: T1 · sagittal · 3.0mm · 0.91mm/px · 3 of 15 slices shown]
[im 3/15]
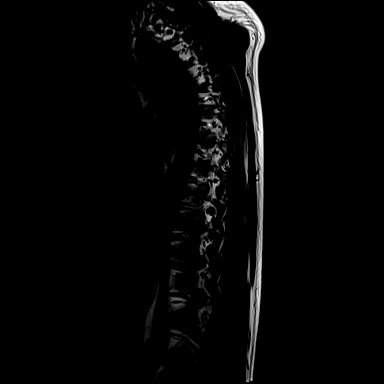
[im 9/15]
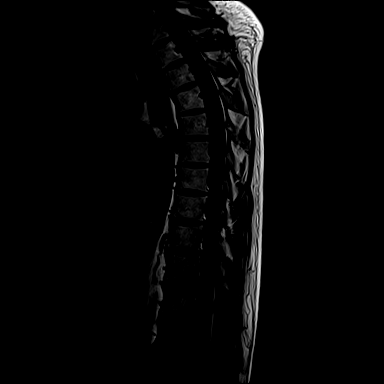
[im 15/15]
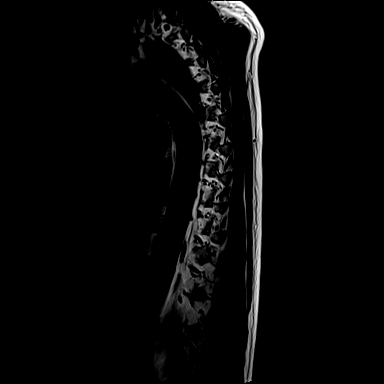

[Series 18: STIR · sagittal · 3.0mm · 1.09mm/px · 3 of 15 slices shown]
[im 3/15]
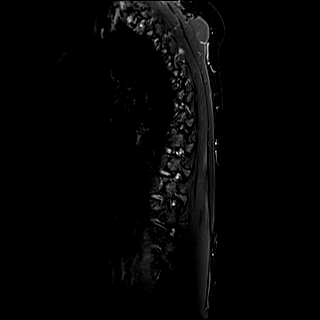
[im 9/15]
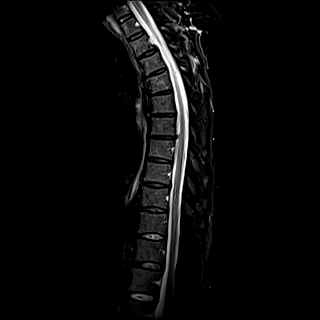
[im 15/15]
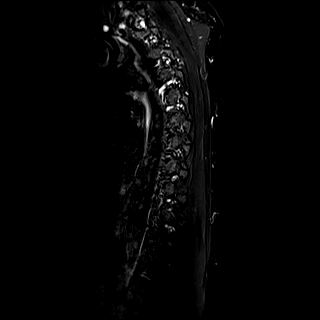

[Series 19: T2 · sagittal · 3.0mm · 0.91mm/px · 6 of 15 slices shown (1 of 2)]
[im 1/15]
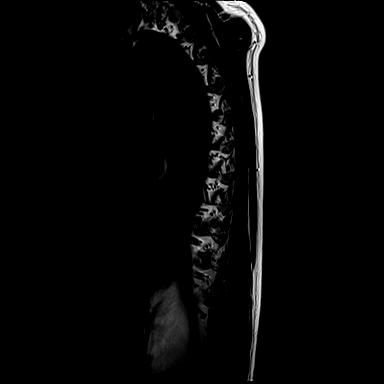
[im 3/15]
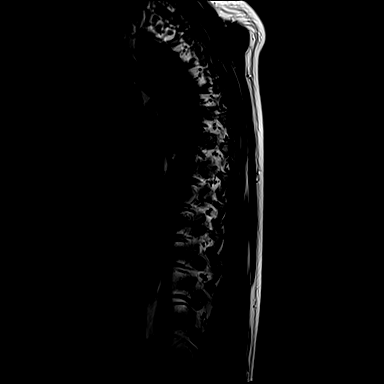
[im 6/15]
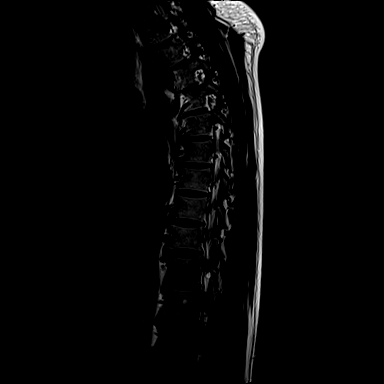
[im 9/15]
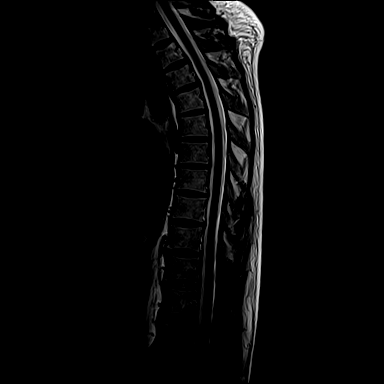
[im 12/15]
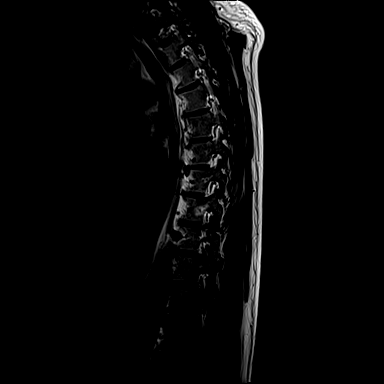
[im 15/15]
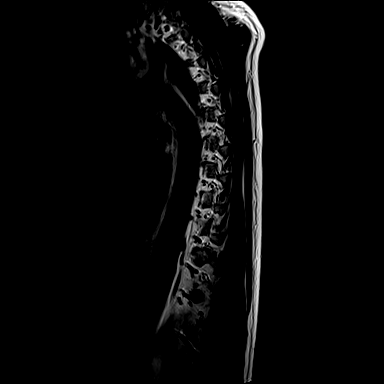

[Series 20: T2 · axial · 4.0mm · 0.28mm/px · z∈[-313,-94]mm · 9 of 36 slices shown (2 of 2)]
[im 1/36]
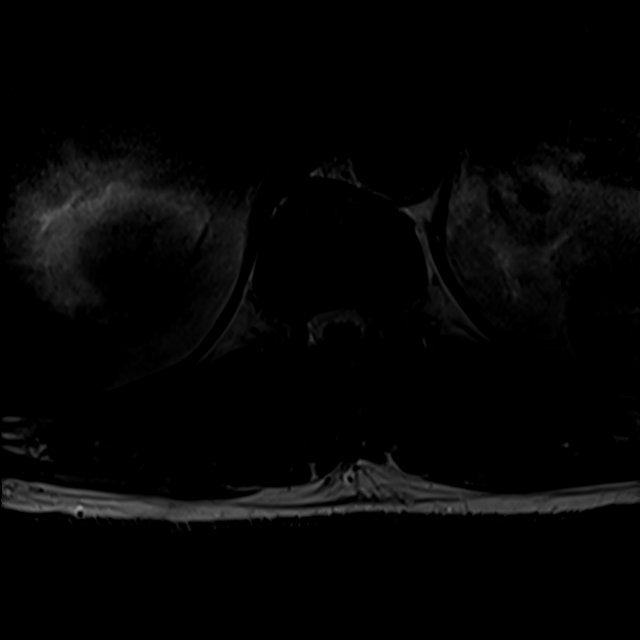
[im 6/36]
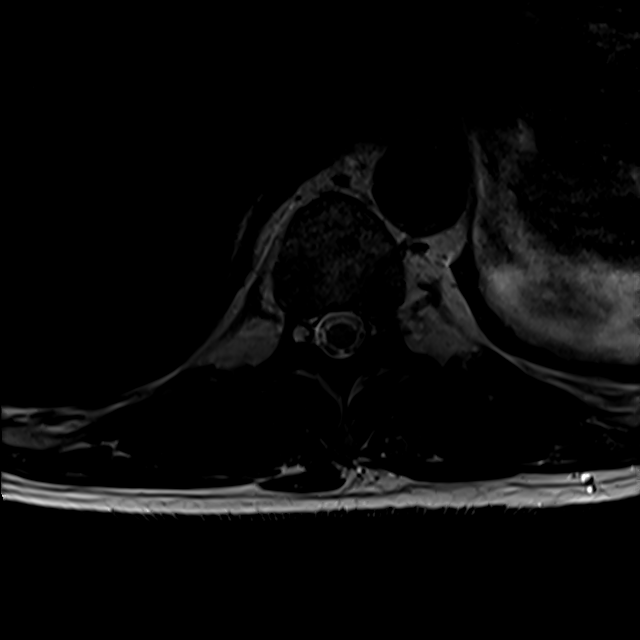
[im 11/36]
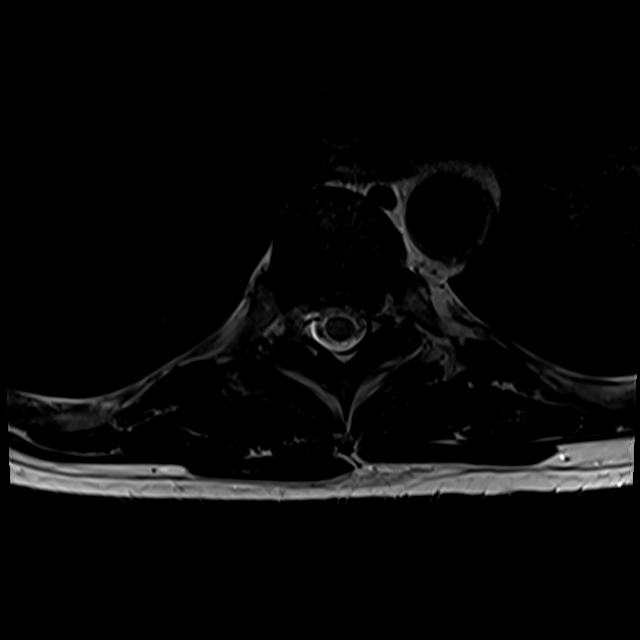
[im 16/36]
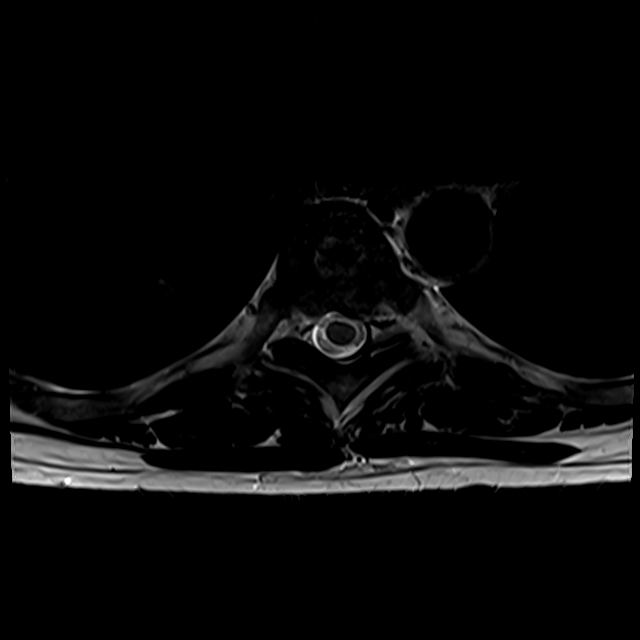
[im 18/36]
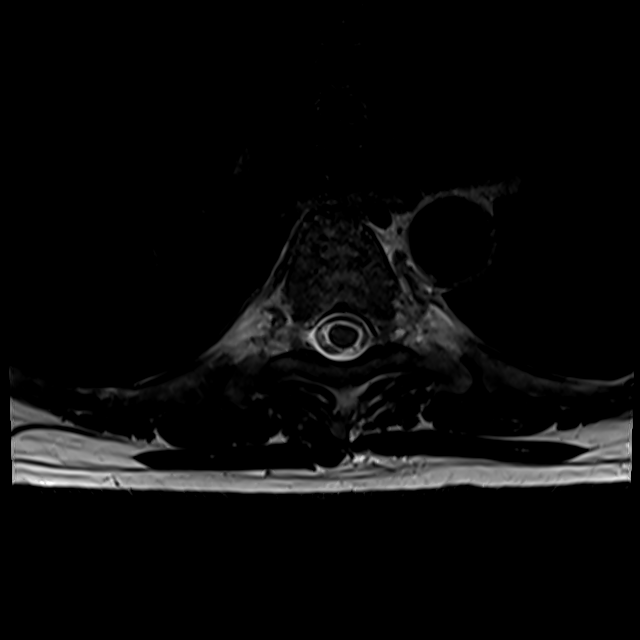
[im 21/36]
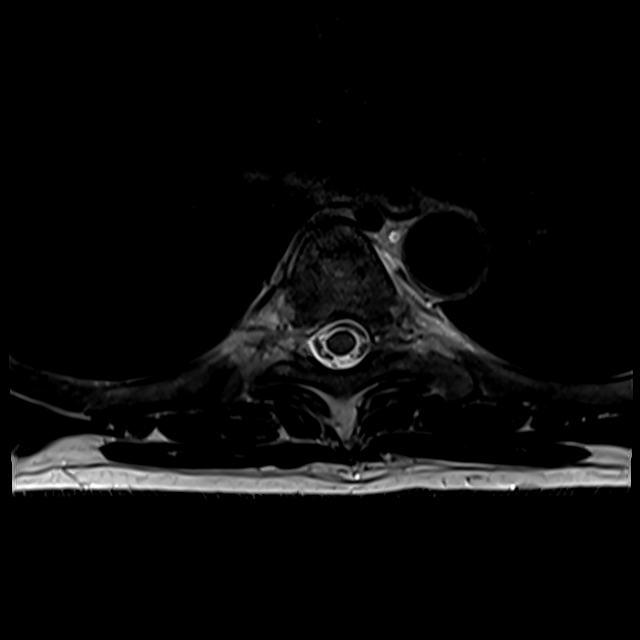
[im 26/36]
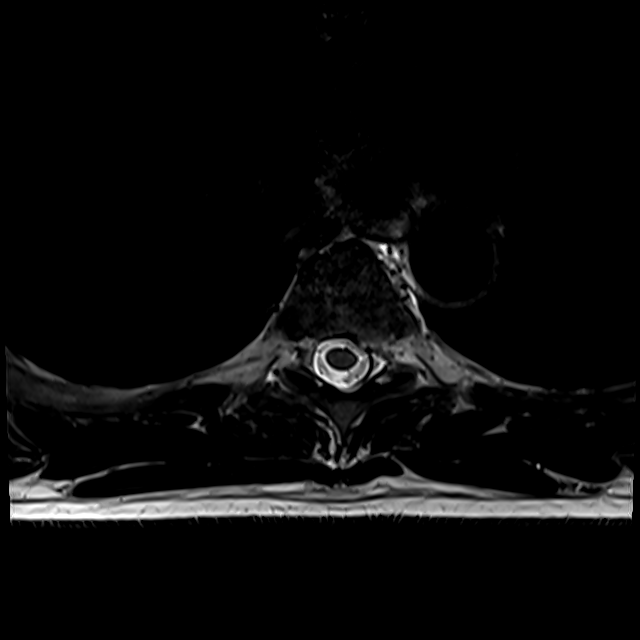
[im 31/36]
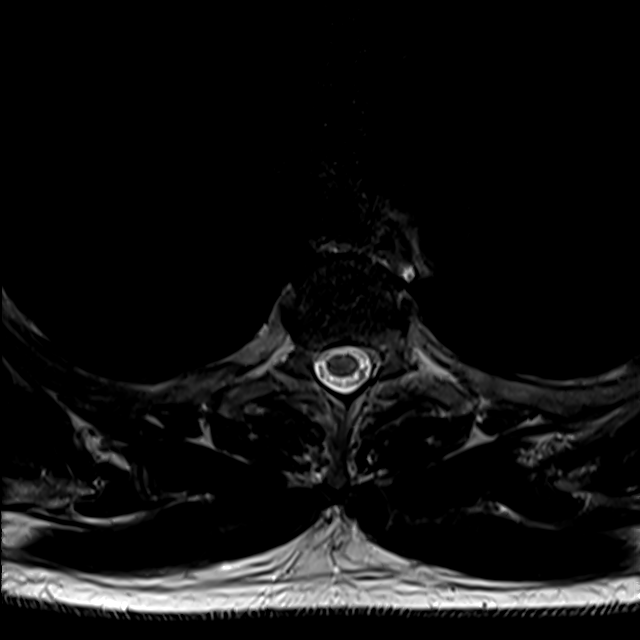
[im 36/36]
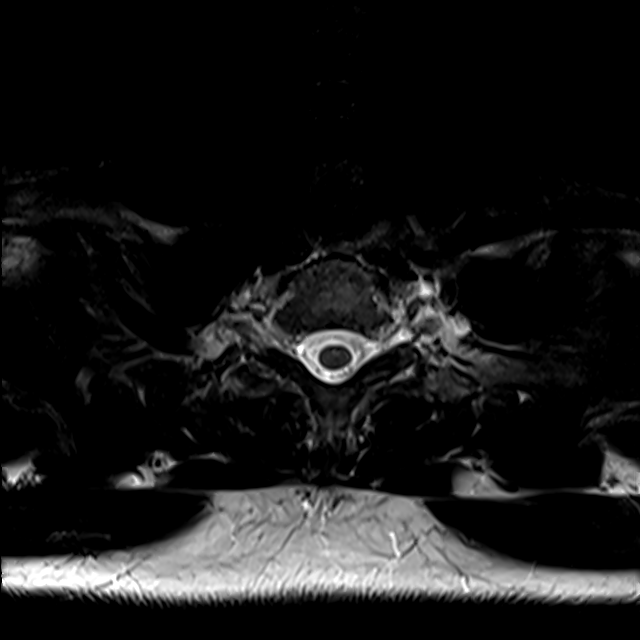

[21 of 48 positions shown; findings below may reference images not displayed]

FINDINGS: Alignment:  Normal.

Vertebrae: No fracture, evidence of discitis, or worrisome bone
lesion. Benign hemangioma in T8 is incidentally noted.

Cord:  Normal signal throughout.

Paraspinal and other soft tissues: Negative.

Disc levels:

T5-6: Minimal right paracentral protrusion without stenosis.

T6-7: Shallow right paracentral protrusion without stenosis.

T7-8: Shallow left paracentral protrusion without stenosis.

T8-9: Shallow left paracentral protrusion without stenosis.

T9-10: Minimal disc bulge without stenosis.

Except as described above, intervertebral disc spaces are
unremarkable.
IMPRESSION: Mild thoracic degenerative change as described above. The central
canal and foramina are widely patent at all levels. No finding to
explain the patient's symptoms.

## 2020-10-26 ENCOUNTER — Other Ambulatory Visit: Payer: Self-pay | Admitting: Neurology

## 2020-10-26 DIAGNOSIS — R252 Cramp and spasm: Secondary | ICD-10-CM

## 2020-10-26 DIAGNOSIS — R202 Paresthesia of skin: Secondary | ICD-10-CM

## 2020-10-26 DIAGNOSIS — R292 Abnormal reflex: Secondary | ICD-10-CM

## 2020-11-15 ENCOUNTER — Ambulatory Visit
Admission: RE | Admit: 2020-11-15 | Discharge: 2020-11-15 | Disposition: A | Discharge status: Home / Self Care | Payer: BC Managed Care – PPO | Source: Ambulatory Visit | Attending: Neurology | Admitting: Neurology

## 2020-11-15 DIAGNOSIS — R252 Cramp and spasm: Secondary | ICD-10-CM

## 2020-11-15 DIAGNOSIS — R292 Abnormal reflex: Secondary | ICD-10-CM

## 2020-11-15 DIAGNOSIS — R202 Paresthesia of skin: Secondary | ICD-10-CM

## 2020-11-15 IMAGING — MR MR HEAD WO/W CM
12 series · 48 of 48 positions shown · IV contrast (multihance)
Comparison: None.

CLINICAL DATA: Leg and hand numbness, hyperreflexia

EXAM:
MRI HEAD WITHOUT AND WITH CONTRAST
TECHNIQUE: Multiplanar, multiecho pulse sequences of the brain and surrounding
structures were obtained without and with intravenous contrast.
CONTRAST:  18mL MULTIHANCE GADOBENATE DIMEGLUMINE 529 MG/ML IV SOLN

[Series 2: T1 · sagittal · 5.0mm · 0.45mm/px · 1 of 23 slices shown]
[im 1/23]
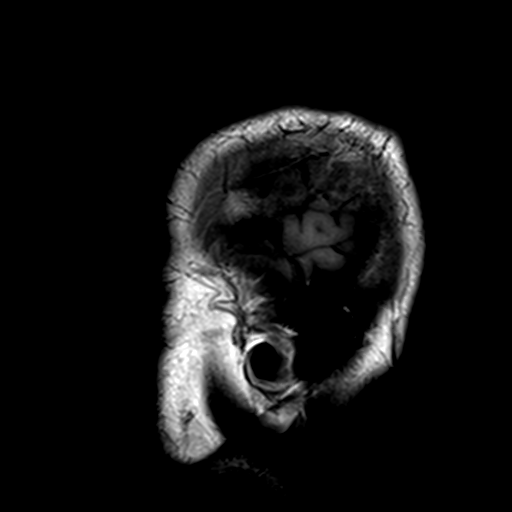

[Series 3: DWI · axial · 3.0mm · 1.80mm/px · z∈[-47,+100]mm · 6 of 100 slices shown (1 of 4)]
[im 1/100]
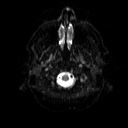
[im 20/100]
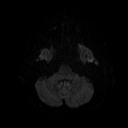
[im 40/100]
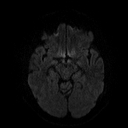
[im 60/100]
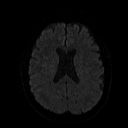
[im 80/100]
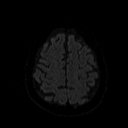
[im 100/100]
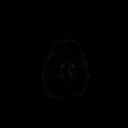

[Series 4: DWI · axial · 3.0mm · 1.80mm/px · z∈[-47,+100]mm · 3 of 48 slices shown (2 of 4)]
[im 1/48]
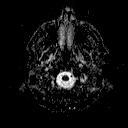
[im 24/48]
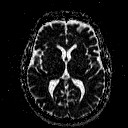
[im 48/48]
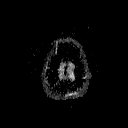

[Series 5: DWI · coronal · 5.0mm · 1.80mm/px · 5 of 67 slices shown (3 of 4)]
[im 1/67]
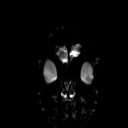
[im 17/67]
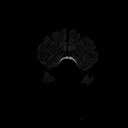
[im 34/67]
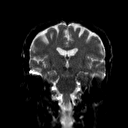
[im 50/67]
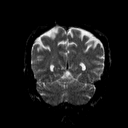
[im 67/67]
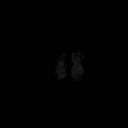

[Series 6: DWI · coronal · 5.0mm · 1.80mm/px · 2 of 34 slices shown (4 of 4)]
[im 1/34]
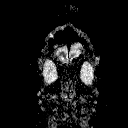
[im 34/34]
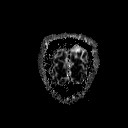

[Series 7: T2 · axial · 5.0mm · 0.60mm/px · z∈[-52,+103]mm · 2 of 24 slices shown (1 of 2)]
[im 1/24]
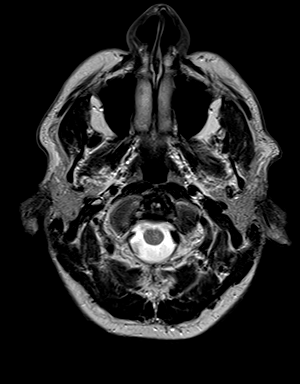
[im 24/24]
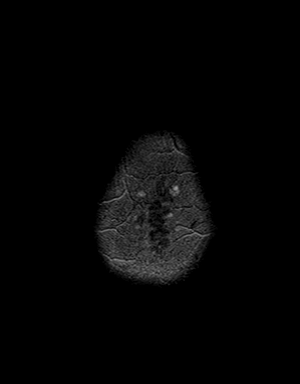

[Series 8: FLAIR · axial · 3.0mm · 0.45mm/px · z∈[-47,+101]mm · 2 of 33 slices shown]
[im 1/33]
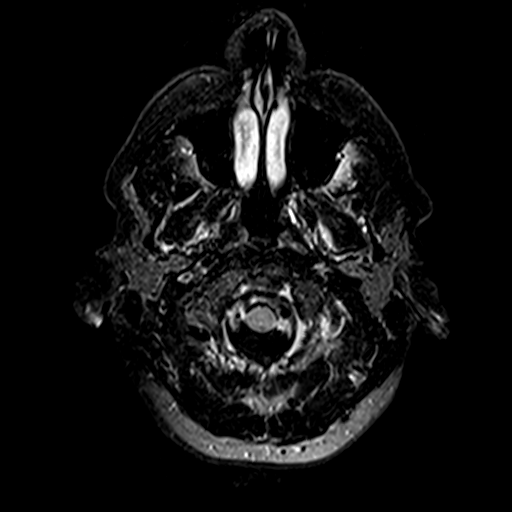
[im 33/33]
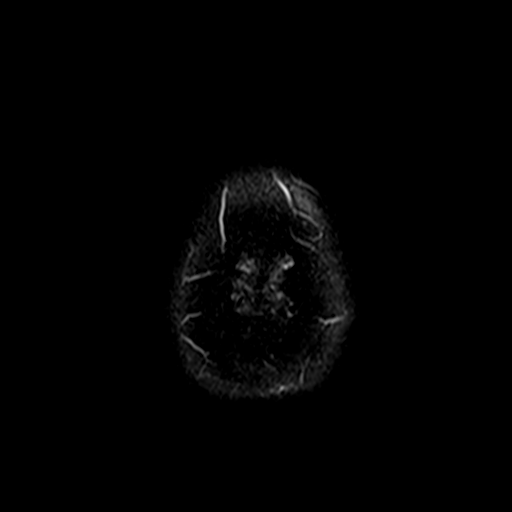

[Series 10: swi_images · axial · 4.0mm · 0.90mm/px · z∈[-54,+102]mm · 3 of 40 slices shown]
[im 1/40]
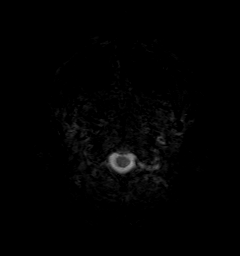
[im 20/40]
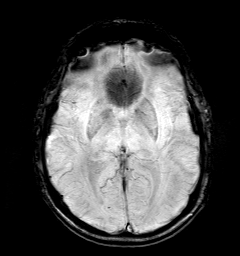
[im 40/40]
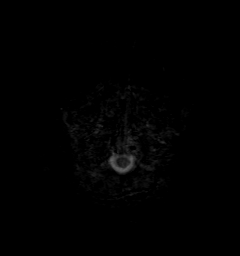

[Series 11: t1_mpr_tra · axial · 1.0mm · 0.75mm/px · z∈[-47,+95]mm · 10 of 144 slices shown]
[im 1/144]
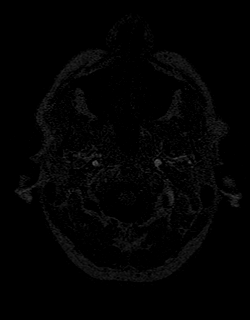
[im 16/144]
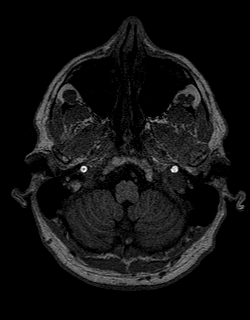
[im 32/144]
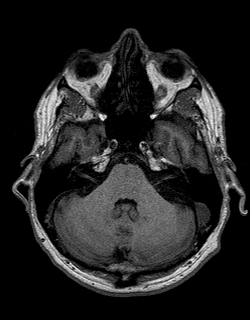
[im 48/144]
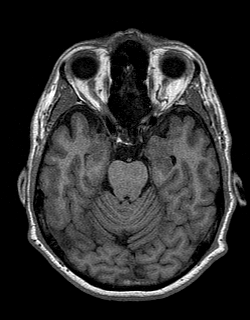
[im 64/144]
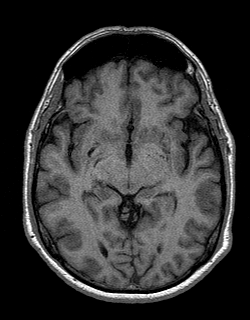
[im 80/144]
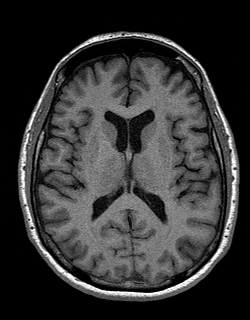
[im 96/144]
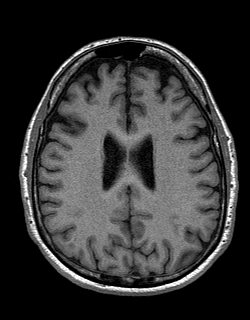
[im 112/144]
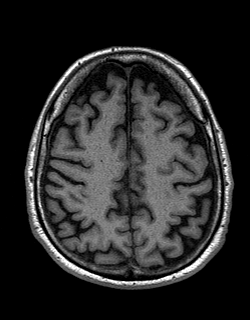
[im 128/144]
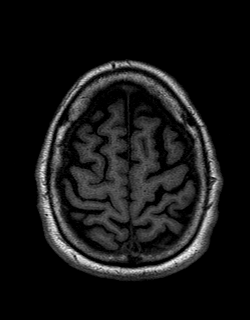
[im 144/144]
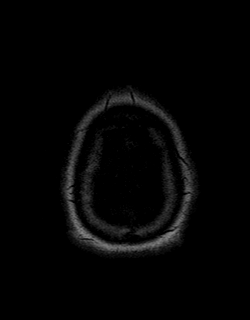

[Series 12: T2 · coronal · 5.0mm · 0.45mm/px · 2 of 28 slices shown (2 of 2)]
[im 1/28]
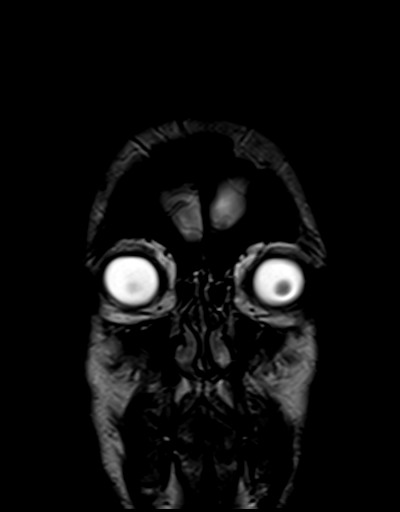
[im 28/28]
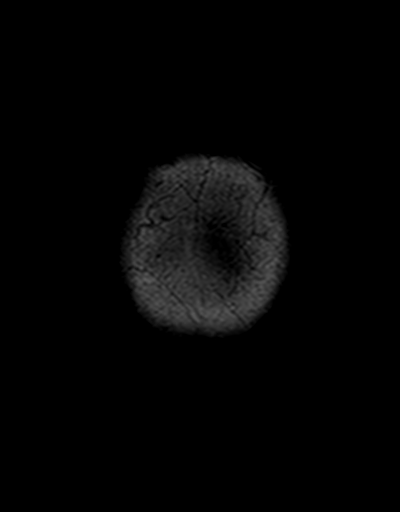

[Series 13: t1_mpr_tra post · axial · 1.0mm · 0.75mm/px · z∈[-47,+95]mm · 10 of 144 slices shown]
[im 1/144]
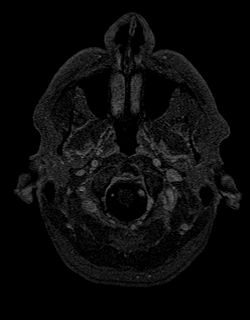
[im 16/144]
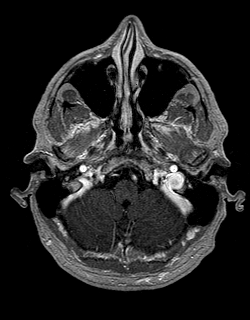
[im 32/144]
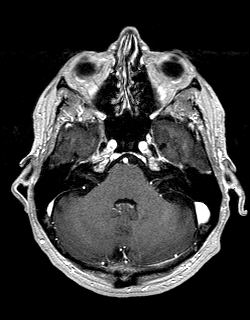
[im 48/144]
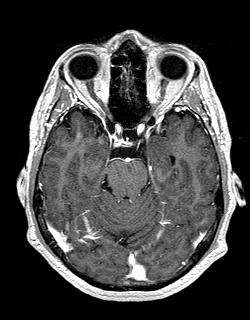
[im 64/144]
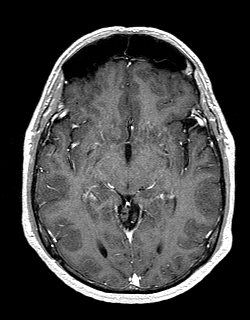
[im 80/144]
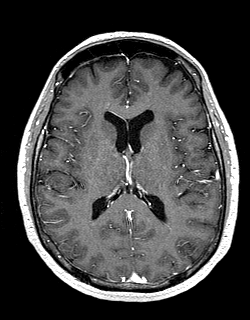
[im 96/144]
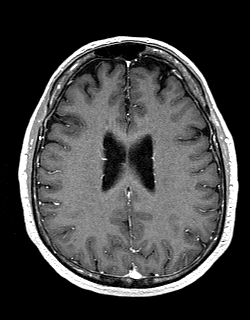
[im 112/144]
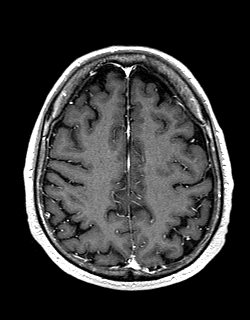
[im 128/144]
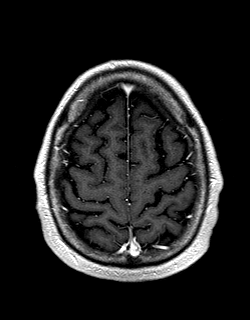
[im 144/144]
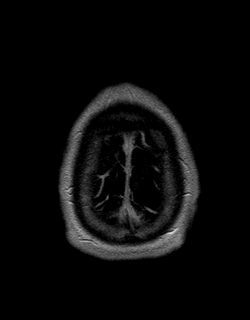

[Series 14: post cor · coronal · 5.0mm · 0.45mm/px · 2 of 28 slices shown]
[im 1/28]
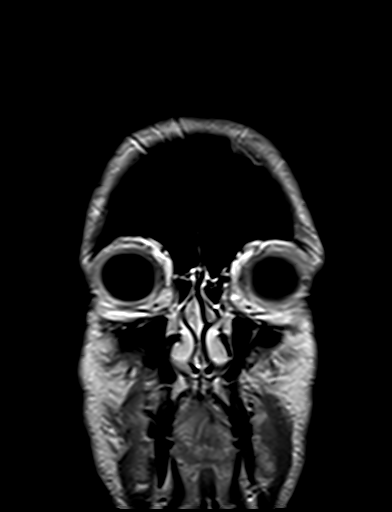
[im 28/28]
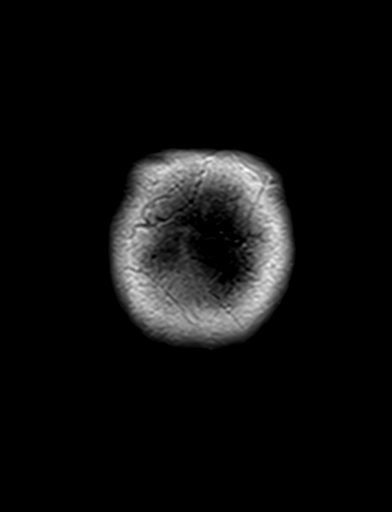

[48 of 48 positions shown; findings below may reference images not displayed]

FINDINGS: Brain: No acute infarction, hemorrhage, hydrocephalus, extra-axial
collection, or mass lesion. The ventricles and sulci are normal for
age.

Vascular: Normal flow voids.

Skull and upper cervical spine: Normal marrow signal.

Sinuses/Orbits: Negative.

Other: The mastoids are well aerated.
IMPRESSION: Normal MRI of the brain.  No acute intracranial process.

## 2020-11-15 MED ORDER — GADOBENATE DIMEGLUMINE 529 MG/ML IV SOLN
18.0000 mL | Freq: Once | INTRAVENOUS | Status: AC | PRN
  Administered 2020-11-15: 18 mL via INTRAVENOUS

## 2020-11-22 ENCOUNTER — Other Ambulatory Visit: Payer: Self-pay

## 2020-11-22 ENCOUNTER — Ambulatory Visit: Payer: BC Managed Care – PPO | Admitting: Neurology

## 2020-11-22 DIAGNOSIS — R202 Paresthesia of skin: Secondary | ICD-10-CM | POA: Diagnosis not present

## 2020-11-22 DIAGNOSIS — R252 Cramp and spasm: Secondary | ICD-10-CM

## 2020-11-22 NOTE — Progress Notes (Signed)
Follow-up Visit   Date: 11/22/20   Austin Austin MRN: [id number] DOB: 1960-06-10   Interim History: Austin Austin is a 60 y.o. right-handed Caucasian male returning to the clinic for follow-up of muscle cramps and paresthesias.  The patient was accompanied to the clinic by self.   History of present illness: Starting in January 2021, he woke up with severe leg cramps and numbness involving his right lower leg. A month later, he developed tingling in the left leg. He was referred to see a chiropractor which did not help.  He also reports tingling in the fingertips.  His symptoms are constant and he feels that exercise and cold makes it worse.  He has chronic low back pain. He has problems with coordination, especially in the hands.  No weakness or falls.  He sometimes feels the he is veering to the side when walking, but it is very subtle.  Vitamin B12, folate, ESR has been normal.   He drinks alcohol socially on the weekend. No diabetes.  He also reports easily getting tired.  UPDATE 10/17/2020:  He is here for follow-up visit.  At his last visit, imaging of the cervical spine and lumbar spine was ordered to evaluate his symptoms.  MRI cervical spine shows disc osteophyte at C5-6 with biforaminal stenosis, worse on the right where it is severe.  MRI lumbar spine shows disc protrusion at L4-5 causing left L4 nerve root impingement.  He completed PT and he feels that stretching has helped a little.  He continues to get cramps and has constant numbness/tingling in the feet and lower legs.  Hand paresthesias are intermittent.  Activity tends to make his symptoms worse.   UPDATE 11/22/2020:  He is here for EDX and discuss results.  See below.  Symptoms are unchanged and continue to have muscle cramps, numbness/tingling of the hands and lower legs/feet.  There was one day last week, when his cramps and stiffness completely went away, but then returned the following day.   Medications:  Current  Outpatient Medications on File Prior to Visit  Medication Sig Dispense Refill   cyclobenzaprine (FLEXERIL) 5 MG tablet Take 1 tablet (5 mg total) by mouth at bedtime as needed for muscle spasms. 30 tablet 1   Loratadine 10 MG CAPS 1 tablet     Multiple Vitamins-Minerals (MULTIVITAMIN ADULTS 50+) TABS See admin instructions.     No current facility-administered medications on file prior to visit.    Allergies:  Allergies  Allergen Reactions   Bee Venom Rash and Swelling    Vital Signs:  There were no vitals taken for this visit.  Deferred, See prior note for exam  Data: MRI cervical spine wo contrast 06/13/2020: 1. Right eccentric disc osteophyte at C5-6 with resultant mildcanal, with severe right and moderate left C6 foraminal stenosis. 2. Degenerative disc osteophyte at C6-7 with resultant mild to moderate left worse than right C7 foraminal stenosis.  MRI lumbar spine wo contrast 05/20/2020: Lumbar spine spondylosis most notable at L4-L5 with a left far lateral disc protrusion contacting the left L4 nerve root without impingement. There is also mild bilateral neural foraminal narrowing.   MRI brain wwo contrast 11/16/2020:  Normal MRI of the brain.  No acute intracranial process.  MRI thoracic spine wo contrast 10/25/2020:  Mild thoracic degenerative change as described above. The central canal and foramina are widely patent at all levels. No finding to explain the patient's symptoms.  NCS/EMG of the right arm and leg 11/22/2020:  This is a normal study of the right upper and lower extremities.  In particular, there is no evidence of a sensorimotor polyneuropathy, cervical/lumbosacral radiculopathy, or carpal tunnel syndrome.   IMPRESSION/PLAN:  Muscle cramps  Bilateral hand paresthesias  Bilateral feet and lower leg paresthesia  MRI cervical spine shows disc osteophyte at C5-6 with biforaminal stenosis, worse on the right where it is severe. Although this may manifesting with  radicular arm symptoms, it would not explain his leg paresthesias or cramps.  MRI lumbar spine shows disc protrusion at L4-5 causing left L4 nerve root impingement. No impingement to cause right leg symptoms.  MRI brain and thoracic spine does not show any pathology explain symptoms.  NCS/EMG of the right arm and leg is also normal, no evidence of neuropathy.  I explained that his evaluation has been extensive and does not disclose etiology for symptoms. No findings to suggest neuropathy, multiple sclerosis, nutritional deficiency, or myelopathy. Fortunately, symptoms are manageable and does not impact his daily life.  He will continue to monitor symptoms, if they progress, we may need to seek a second opinion.   Thank you for allowing me to participate in patient's care.  If I can answer any additional questions, I would be pleased to do so.    Sincerely,    Baillie Mohammad K. Posey Pronto, DO

## 2020-11-22 NOTE — Procedures (Signed)
[facility]  [address], [address]  [place], [place] [address] Tel: [phone] Fax:  [phone] Test Date:  11/22/2020  Patient: Austin Austin DOB: 02-Jul-1960 Physician: Nita Sickle, DO  Sex: Male Height: 6\' 1"  Ref Phys: , DO  ID#: Nita Sickle   Technician:    Patient Complaints: This is a 60 year old man referred for evaluation of generalized paresthesias of the arms and legs.  NCV & EMG Findings: Extensive electrodiagnostic testing of the right upper and lower extremity shows:  All sensory responses including the right median, ulnar, mixed palmar, sural, and superficial peroneal nerves are within normal limits. All motor responses including the right median, ulnar, peroneal, and tibial nerves are within normal limits. Right tibial H reflex study is within normal limits. There is no evidence of active or chronic motor axonal loss changes affecting any of the tested muscles.  Motor unit configuration and recruitment pattern is within normal limits.  Impression: This is a normal study of the right upper and lower extremities.  In particular, there is no evidence of a sensorimotor polyneuropathy, cervical/lumbosacral radiculopathy, or carpal tunnel syndrome.   ___________________________ 41, DO    Nerve Conduction Studies Anti Sensory Summary Table   Stim Site NR Peak (ms) Norm Peak (ms) P-T Amp (V) Norm P-T Amp  Right Median Anti Sensory (2nd Digit)  32C  Wrist    3.2 <3.6 48.5 >15  Right Sup Peroneal Anti Sensory (Ant Lat Mall)  32C  12 cm    2.7 <4.6 12.0 >4  Right Sural Anti Sensory (Lat Mall)  32C  Calf    3.5 <4.6 22.0 >4  Right Ulnar Anti Sensory (5th Digit)  32C  Wrist    3.0 <3.1 29.9 >10   Motor Summary Table   Stim Site NR Onset (ms) Norm Onset (ms) O-P Amp (mV) Norm O-P Amp Site1 Site2 Delta-0 (ms) Dist (cm) Vel (m/s) Norm Vel (m/s)  Right Median Motor (Abd Poll Brev)  32C  Wrist    3.2 <4.0 7.6 >6 Elbow Wrist 5.1  31.0 61 >50  Elbow    8.3  7.2         Right Peroneal Motor (Ext Dig Brev)  32C  Ankle    3.7 <6.0 8.5 >2.5 B Fib Ankle 8.5 41.0 48 >40  B Fib    12.2  8.5  Poplt B Fib 1.9 10.0 53 >40  Poplt    14.1  8.5         Right Tibial Motor (Abd Hall Brev)  32C  Ankle    5.0 <6.0 10.5 >4 Knee Ankle 9.1 48.0 53 >40  Knee    14.1  8.1         Right Ulnar Motor (Abd Dig Minimi)  32C  Wrist    2.4 <3.1 8.5 >7 B Elbow Wrist 4.2 24.0 57 >50  B Elbow    6.6  7.9  A Elbow B Elbow 1.8 10.0 56 >50  A Elbow    8.4  7.2          Comparison Summary Table   Stim Site NR Peak (ms) Norm Peak (ms) P-T Amp (V) Site1 Site2 Delta-P (ms) Norm Delta (ms)  Right Median/Ulnar Palm Comparison (Wrist - 8cm)  32C  Median Palm    1.7 <2.2 44.8 Median Palm Ulnar Palm 0.2   Ulnar Palm    1.5 <2.2 17.7       H Reflex Studies   NR H-Lat (ms)  Lat Norm (ms) L-R H-Lat (ms)  Right Tibial (Gastroc)  32C     33.88 <35    EMG   Side Muscle Ins Act Fibs Psw Fasc Number Recrt Dur Dur. Amp Amp. Poly Poly. Comment  Right AntTibialis Nml Nml Nml Nml Nml Nml Nml Nml Nml Nml Nml Nml N/A  Right 1stDorInt Nml Nml Nml Nml Nml Nml Nml Nml Nml Nml Nml Nml N/A  Right PronatorTeres Nml Nml Nml Nml Nml Nml Nml Nml Nml Nml Nml Nml N/A  Right Biceps Nml Nml Nml Nml Nml Nml Nml Nml Nml Nml Nml Nml N/A  Right Triceps Nml Nml Nml Nml Nml Nml Nml Nml Nml Nml Nml Nml N/A  Right Deltoid Nml Nml Nml Nml Nml Nml Nml Nml Nml Nml Nml Nml N/A  Right Gastroc Nml Nml Nml Nml Nml Nml Nml Nml Nml Nml Nml Nml N/A  Right Flex Dig Long Nml Nml Nml Nml Nml Nml Nml Nml Nml Nml Nml Nml N/A  Right RectFemoris Nml Nml Nml Nml Nml Nml Nml Nml Nml Nml Nml Nml N/A  Right GluteusMed Nml Nml Nml Nml Nml Nml Nml Nml Nml Nml Nml Nml N/A      Waveforms:

## 2021-01-09 ENCOUNTER — Telehealth: Payer: Self-pay | Admitting: Neurology

## 2021-01-09 DIAGNOSIS — R202 Paresthesia of skin: Secondary | ICD-10-CM

## 2021-01-09 DIAGNOSIS — R292 Abnormal reflex: Secondary | ICD-10-CM

## 2021-01-09 DIAGNOSIS — R252 Cramp and spasm: Secondary | ICD-10-CM

## 2021-01-09 NOTE — Telephone Encounter (Signed)
Pt called in stating Dr. Allena Katz had mentioned his next step would be to going to one of the bigger medical communities like [facility] or [facility]. He would like to see how to go about getting that started.

## 2021-01-09 NOTE — Telephone Encounter (Signed)
Patient returned call and was informed that we will send a referral to [facility]. Patient will call me back on a week if he does not hear anything from [facility]. Patient had no further questions or concerns.

## 2021-01-09 NOTE — Telephone Encounter (Signed)
Called patient and left a message for a call back.  

## 2021-01-09 NOTE — Telephone Encounter (Signed)
OK to send referral to [facility] for muscle cramps/paresthesias.  Thanks.
